# Patient Record
Sex: Male | Born: 1955 | Race: White | Hispanic: No | Smoking: Never smoker
Health system: Southern US, Community
[De-identification: ages and names within clinical notes are randomized; demographics above are authoritative.]

## PROBLEM LIST (undated history)

## (undated) DIAGNOSIS — G459 Transient cerebral ischemic attack, unspecified: Secondary | ICD-10-CM

## (undated) DIAGNOSIS — I639 Cerebral infarction, unspecified: Secondary | ICD-10-CM

## (undated) DIAGNOSIS — D6861 Antiphospholipid syndrome: Secondary | ICD-10-CM

## (undated) DIAGNOSIS — F32A Depression, unspecified: Secondary | ICD-10-CM

## (undated) DIAGNOSIS — I10 Essential (primary) hypertension: Secondary | ICD-10-CM

## (undated) DIAGNOSIS — E785 Hyperlipidemia, unspecified: Secondary | ICD-10-CM

## (undated) HISTORY — DX: Depression, unspecified: F32.A

## (undated) HISTORY — DX: Essential (primary) hypertension: I10

## (undated) HISTORY — DX: Hyperlipidemia, unspecified: E78.5

## (undated) HISTORY — DX: Antiphospholipid syndrome: D68.61

---

## 2009-11-29 ENCOUNTER — Emergency Department (HOSPITAL_COMMUNITY): Admission: EM | Admit: 2009-11-29 | Discharge: 2009-11-29 | Payer: Self-pay | Admitting: Emergency Medicine

## 2011-11-18 IMAGING — CT CT HEAD W/O CM
1 series · 15 of 30 positions shown, 19 images · non-contrast
Comparison: None.

CLINICAL DATA: Headache, dizziness and vomiting.

CT HEAD WITHOUT CONTRAST
TECHNIQUE: Contiguous axial images were obtained from the base of
the skull through the vertex without contrast.

[Series 2: headseq 4.8 h37s · axial · 0.51mm/px · z∈[+150,+318]mm · 15 of 36 slices shown, 19 images]
[im 2/36  brain]
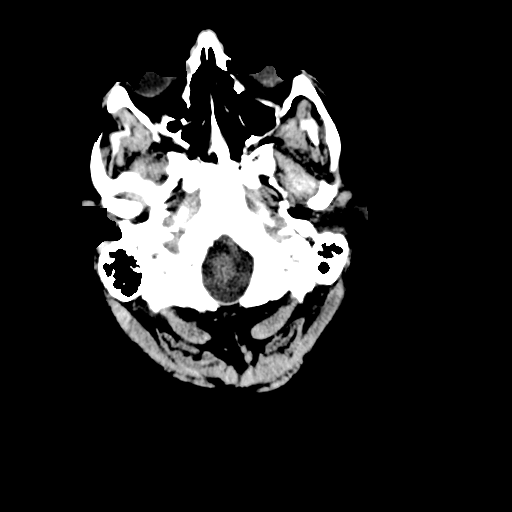
[im 2/36  bone]
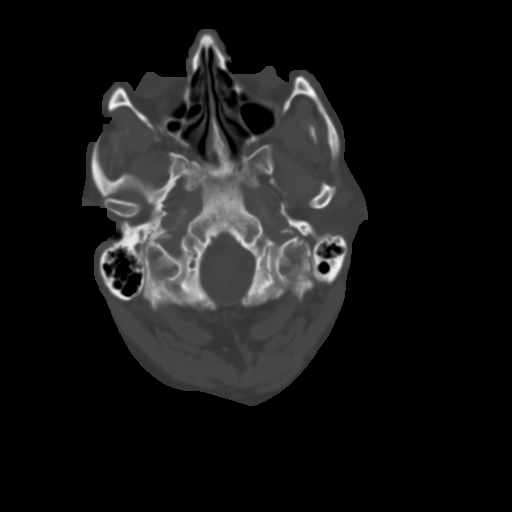
[im 4/36  brain]
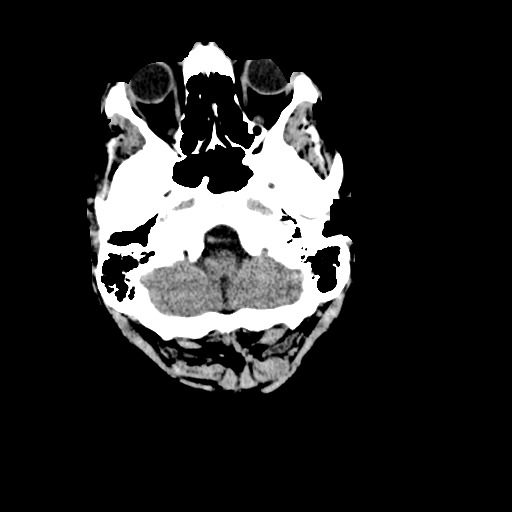
[im 7/36  brain]
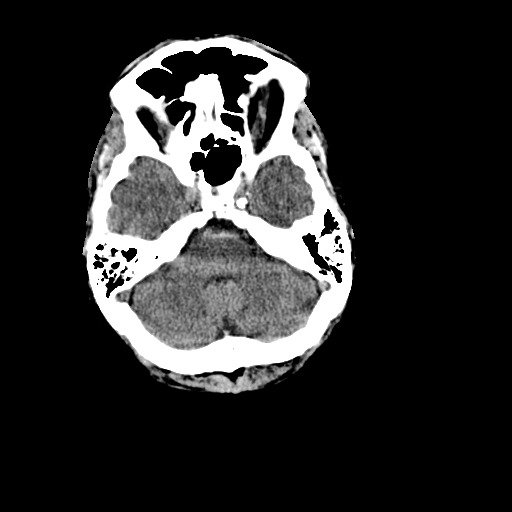
[im 9/36  brain]
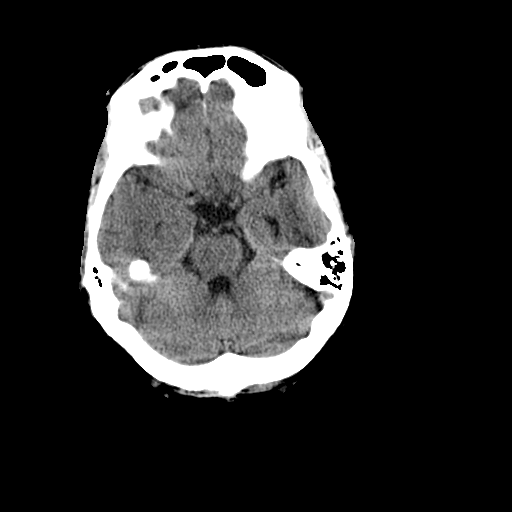
[im 11/36  brain]
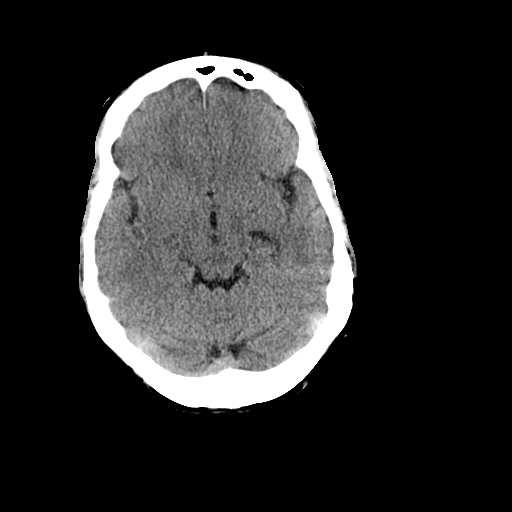
[im 11/36  bone]
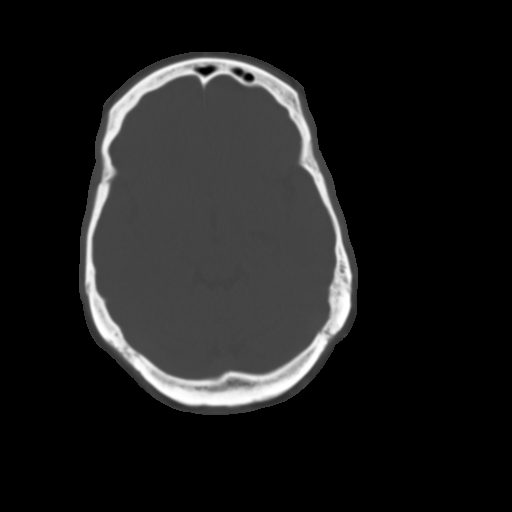
[im 14/36  brain]
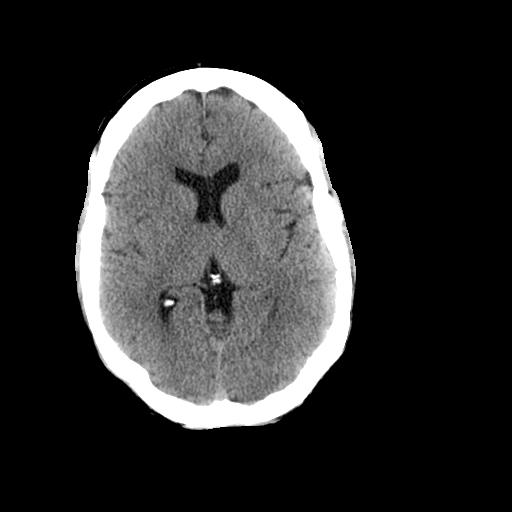
[im 16/36  brain]
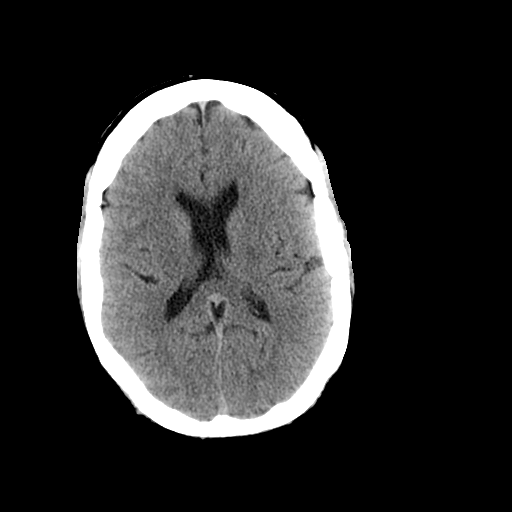
[im 19/36  brain]
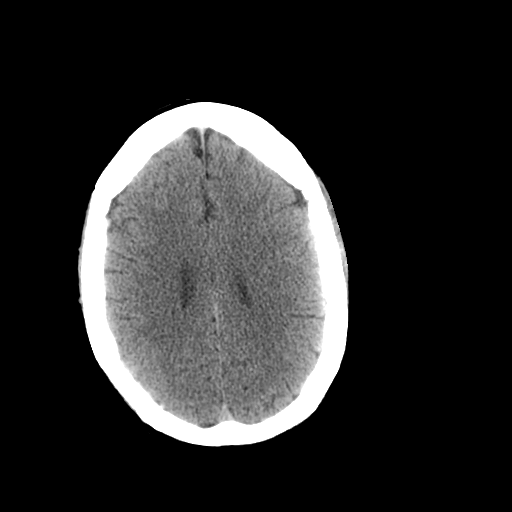
[im 20/36  brain]
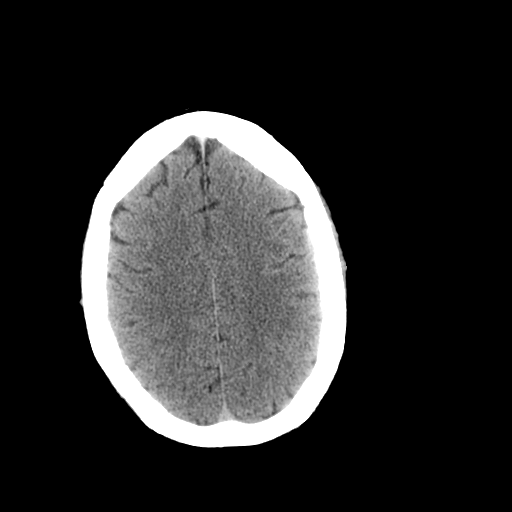
[im 20/36  bone]
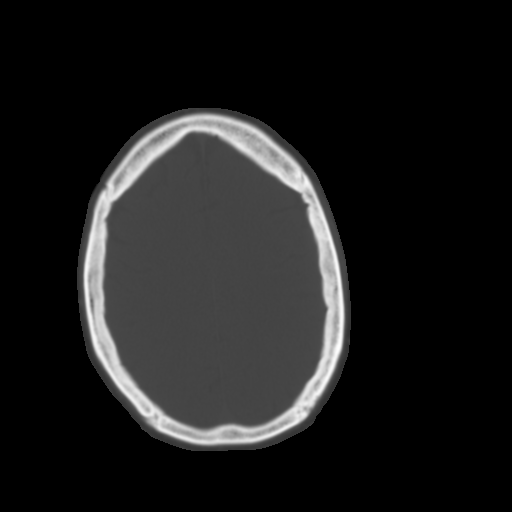
[im 22/36  brain]
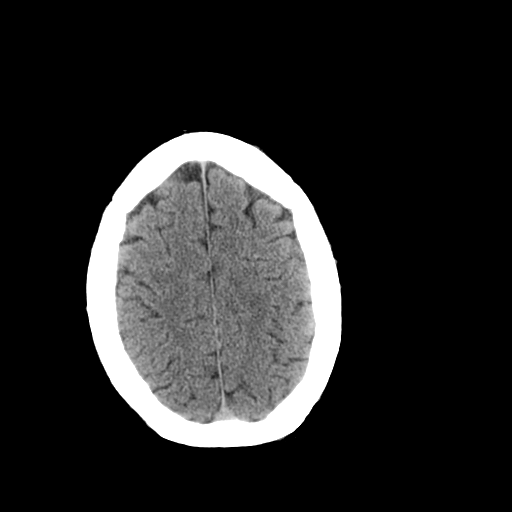
[im 25/36  brain]
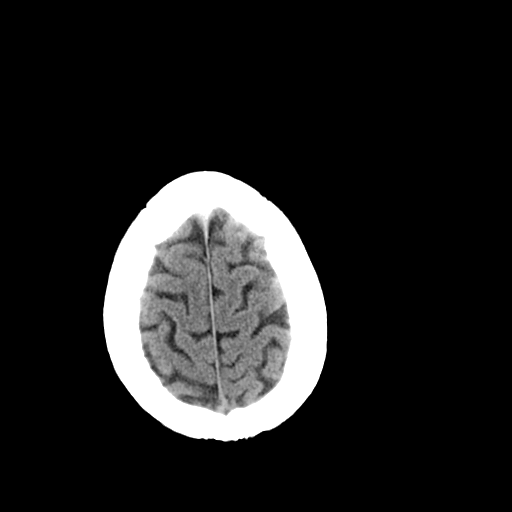
[im 27/36  brain]
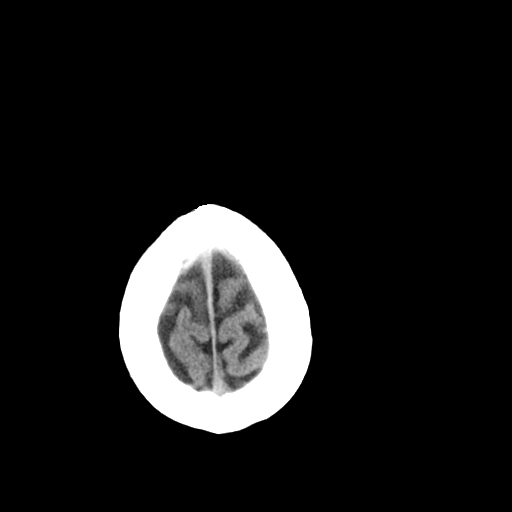
[im 29/36  brain]
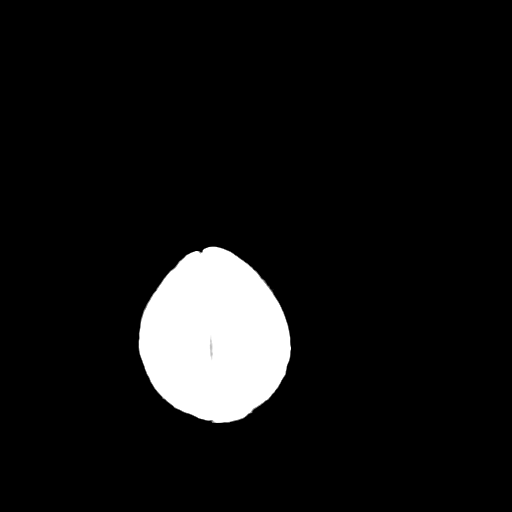
[im 29/36  bone]
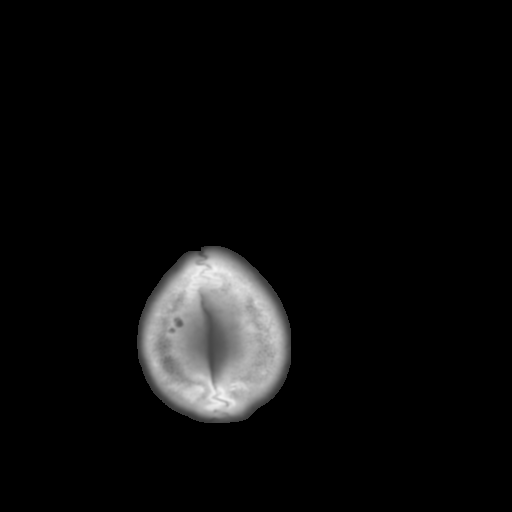
[im 32/36  brain]
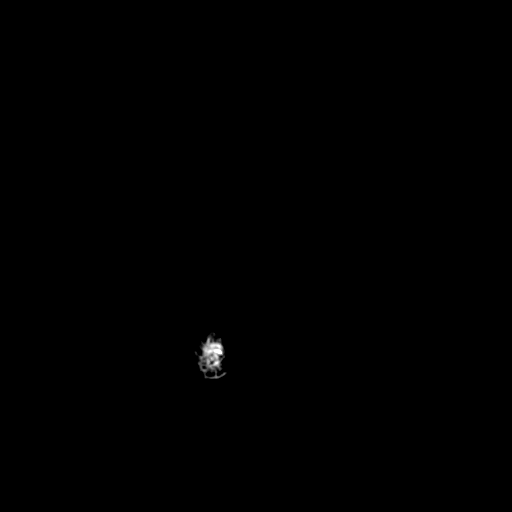
[im 34/36  brain]
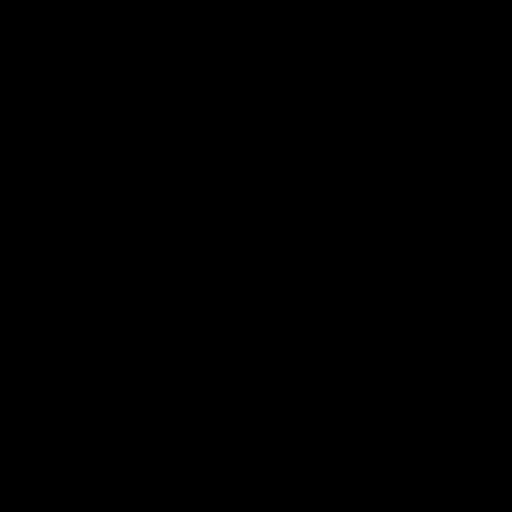

[15 of 30 positions shown; findings below may reference images not displayed]

FINDINGS: There is no evidence of acute infarction, mass lesion, or
intra- or extra-axial hemorrhage on CT.

Mild periventricular white matter change likely reflects small
vessel ischemic microangiopathy.  Minimal hypodensity within the
left basal ganglia may reflect chronic ischemic change.

The posterior fossa, including the cerebellum, brainstem and fourth
ventricle, is within normal limits.  The third and lateral
ventricles are unremarkable in appearance.  The cerebral
hemispheres are symmetric in appearance, with normal gray-white
differentiation.  No mass effect or midline shift is seen.

There is no evidence of fracture; visualized osseous structures are
unremarkable in appearance.  The visualized portions of the orbits
are within normal limits.  The paranasal sinuses and mastoid air
cells are well-aerated.  No significant soft tissue abnormalities
are seen.
IMPRESSION: 1.  No acute intracranial pathology seen.
2.  Mild small vessel ischemic microangiopathy; minimal hypodensity
within the left basal ganglia may reflect chronic ischemic change.

## 2019-10-15 ENCOUNTER — Other Ambulatory Visit: Payer: Self-pay

## 2019-10-15 ENCOUNTER — Inpatient Hospital Stay (HOSPITAL_COMMUNITY)
Admission: EM | Admit: 2019-10-15 | Discharge: 2019-10-18 | DRG: 071 | Disposition: A | Discharge status: Home / Self Care | Payer: Medicare Other | Attending: Family Medicine | Admitting: Family Medicine

## 2019-10-15 DIAGNOSIS — Z20822 Contact with and (suspected) exposure to covid-19: Secondary | ICD-10-CM | POA: Diagnosis present

## 2019-10-15 DIAGNOSIS — I119 Hypertensive heart disease without heart failure: Secondary | ICD-10-CM | POA: Diagnosis present

## 2019-10-15 DIAGNOSIS — R6510 Systemic inflammatory response syndrome (SIRS) of non-infectious origin without acute organ dysfunction: Secondary | ICD-10-CM | POA: Diagnosis present

## 2019-10-15 DIAGNOSIS — G9340 Encephalopathy, unspecified: Secondary | ICD-10-CM | POA: Diagnosis present

## 2019-10-15 DIAGNOSIS — D696 Thrombocytopenia, unspecified: Secondary | ICD-10-CM | POA: Diagnosis present

## 2019-10-15 DIAGNOSIS — R9431 Abnormal electrocardiogram [ECG] [EKG]: Secondary | ICD-10-CM | POA: Diagnosis present

## 2019-10-15 DIAGNOSIS — G9341 Metabolic encephalopathy: Principal | ICD-10-CM | POA: Diagnosis present

## 2019-10-15 DIAGNOSIS — D72829 Elevated white blood cell count, unspecified: Secondary | ICD-10-CM | POA: Diagnosis present

## 2019-10-15 DIAGNOSIS — R17 Unspecified jaundice: Secondary | ICD-10-CM | POA: Diagnosis present

## 2019-10-15 DIAGNOSIS — D649 Anemia, unspecified: Secondary | ICD-10-CM | POA: Diagnosis present

## 2019-10-15 DIAGNOSIS — R531 Weakness: Secondary | ICD-10-CM | POA: Diagnosis present

## 2019-10-15 DIAGNOSIS — Z8673 Personal history of transient ischemic attack (TIA), and cerebral infarction without residual deficits: Secondary | ICD-10-CM

## 2019-10-15 DIAGNOSIS — E876 Hypokalemia: Secondary | ICD-10-CM | POA: Diagnosis not present

## 2019-10-15 DIAGNOSIS — R509 Fever, unspecified: Secondary | ICD-10-CM | POA: Diagnosis present

## 2019-10-15 HISTORY — DX: Transient cerebral ischemic attack, unspecified: G45.9

## 2019-10-15 HISTORY — DX: Cerebral infarction, unspecified: I63.9

## 2019-10-15 NOTE — ED Triage Notes (Signed)
Pt arrives via EMS for altered mental status, fever, hypertension, and cough.

## 2019-10-16 ENCOUNTER — Emergency Department (HOSPITAL_COMMUNITY): Payer: Medicare Other

## 2019-10-16 ENCOUNTER — Encounter (HOSPITAL_COMMUNITY): Payer: Self-pay | Admitting: Emergency Medicine

## 2019-10-16 ENCOUNTER — Other Ambulatory Visit: Payer: Self-pay

## 2019-10-16 DIAGNOSIS — R9431 Abnormal electrocardiogram [ECG] [EKG]: Secondary | ICD-10-CM

## 2019-10-16 DIAGNOSIS — Z8673 Personal history of transient ischemic attack (TIA), and cerebral infarction without residual deficits: Secondary | ICD-10-CM | POA: Diagnosis not present

## 2019-10-16 DIAGNOSIS — R6510 Systemic inflammatory response syndrome (SIRS) of non-infectious origin without acute organ dysfunction: Secondary | ICD-10-CM | POA: Diagnosis present

## 2019-10-16 DIAGNOSIS — G9340 Encephalopathy, unspecified: Secondary | ICD-10-CM | POA: Diagnosis not present

## 2019-10-16 DIAGNOSIS — E876 Hypokalemia: Secondary | ICD-10-CM | POA: Diagnosis present

## 2019-10-16 LAB — CBC WITH DIFFERENTIAL/PLATELET
Abs Immature Granulocytes: 0.15 10*3/uL — ABNORMAL HIGH (ref 0.00–0.07)
Basophils Absolute: 0 10*3/uL (ref 0.0–0.1)
Basophils Relative: 0 %
Eosinophils Absolute: 0 10*3/uL (ref 0.0–0.5)
Eosinophils Relative: 0 %
HCT: 40.6 % (ref 39.0–52.0)
Hemoglobin: 13.7 g/dL (ref 13.0–17.0)
Immature Granulocytes: 1 %
Lymphocytes Relative: 10 %
Lymphs Abs: 1.9 10*3/uL (ref 0.7–4.0)
MCH: 29.7 pg (ref 26.0–34.0)
MCHC: 33.7 g/dL (ref 30.0–36.0)
MCV: 88.1 fL (ref 80.0–100.0)
Monocytes Absolute: 1.5 10*3/uL — ABNORMAL HIGH (ref 0.1–1.0)
Monocytes Relative: 8 %
Neutro Abs: 15.7 10*3/uL — ABNORMAL HIGH (ref 1.7–7.7)
Neutrophils Relative %: 81 %
Platelets: 177 10*3/uL (ref 150–400)
RBC: 4.61 MIL/uL (ref 4.22–5.81)
RDW: 13.7 % (ref 11.5–15.5)
WBC: 19.3 10*3/uL — ABNORMAL HIGH (ref 4.0–10.5)
nRBC: 0 % (ref 0.0–0.2)

## 2019-10-16 LAB — COMPREHENSIVE METABOLIC PANEL
ALT: 15 U/L (ref 0–44)
AST: 15 U/L (ref 15–41)
Albumin: 3.7 g/dL (ref 3.5–5.0)
Alkaline Phosphatase: 99 U/L (ref 38–126)
Anion gap: 11 (ref 5–15)
BUN: 13 mg/dL (ref 8–23)
CO2: 26 mmol/L (ref 22–32)
Calcium: 8 mg/dL — ABNORMAL LOW (ref 8.9–10.3)
Chloride: 95 mmol/L — ABNORMAL LOW (ref 98–111)
Creatinine, Ser: 0.96 mg/dL (ref 0.61–1.24)
GFR calc Af Amer: >60 mL/min (ref >60)
GFR calc non Af Amer: >60 mL/min (ref >60)
Glucose, Bld: 142 mg/dL — ABNORMAL HIGH (ref 70–99)
Potassium: 2.8 mmol/L — ABNORMAL LOW (ref 3.5–5.1)
Sodium: 132 mmol/L — ABNORMAL LOW (ref 135–145)
Total Bilirubin: 2.6 mg/dL — ABNORMAL HIGH (ref 0.3–1.2)
Total Protein: 6.8 g/dL (ref 6.5–8.1)

## 2019-10-16 LAB — BILIRUBIN, FRACTIONATED(TOT/DIR/INDIR)
Bilirubin, Direct: 0.3 mg/dL — ABNORMAL HIGH (ref 0.0–0.2)
Indirect Bilirubin: 2.5 mg/dL — ABNORMAL HIGH (ref 0.3–0.9)
Total Bilirubin: 2.8 mg/dL — ABNORMAL HIGH (ref 0.3–1.2)

## 2019-10-16 LAB — URINALYSIS, ROUTINE W REFLEX MICROSCOPIC
Bacteria, UA: NONE SEEN
Bilirubin Urine: NEGATIVE
Glucose, UA: NEGATIVE mg/dL
Ketones, ur: NEGATIVE mg/dL
Leukocytes,Ua: NEGATIVE
Nitrite: NEGATIVE
Protein, ur: NEGATIVE mg/dL
Specific Gravity, Urine: 1.01 (ref 1.005–1.030)
pH: 6 (ref 5.0–8.0)

## 2019-10-16 LAB — D-DIMER, QUANTITATIVE: D-Dimer, Quant: 0.51 ug/mL-FEU — ABNORMAL HIGH (ref 0.00–0.50)

## 2019-10-16 LAB — FERRITIN: Ferritin: 283 ng/mL (ref 24–336)

## 2019-10-16 LAB — LACTIC ACID, PLASMA
Lactic Acid, Venous: 0.9 mmol/L (ref 0.5–1.9)
Lactic Acid, Venous: 0.8 mmol/L (ref 0.5–1.9)

## 2019-10-16 LAB — PROCALCITONIN: Procalcitonin: <0.1 ng/mL

## 2019-10-16 LAB — C-REACTIVE PROTEIN: CRP: 18.6 mg/dL — ABNORMAL HIGH (ref <1.0)

## 2019-10-16 LAB — SARS CORONAVIRUS 2 BY RT PCR (HOSPITAL ORDER, PERFORMED IN ~~LOC~~ HOSPITAL LAB): SARS Coronavirus 2: NEGATIVE

## 2019-10-16 LAB — LACTATE DEHYDROGENASE: LDH: 128 U/L (ref 98–192)

## 2019-10-16 LAB — MAGNESIUM: Magnesium: 1.8 mg/dL (ref 1.7–2.4)

## 2019-10-16 LAB — TRIGLYCERIDES: Triglycerides: 75 mg/dL (ref <150)

## 2019-10-16 LAB — FIBRINOGEN: Fibrinogen: 664 mg/dL — ABNORMAL HIGH (ref 210–475)

## 2019-10-16 MED ORDER — ACETAMINOPHEN 650 MG RE SUPP: 650.0000 mg | Freq: Four times a day (QID) | RECTAL | Status: DC | PRN

## 2019-10-16 MED ORDER — METRONIDAZOLE IN NACL 5-0.79 MG/ML-% IV SOLN
500.0000 mg | Freq: Three times a day (TID) | INTRAVENOUS | Status: DC
  Administered 2019-10-16 – 2019-10-17 (×4): 500 mg via INTRAVENOUS
  Filled 2019-10-16 (×4): qty 100

## 2019-10-16 MED ORDER — ACETAMINOPHEN 325 MG PO TABS
650.0000 mg | ORAL_TABLET | Freq: Once | ORAL | Status: AC
  Administered 2019-10-16: 650 mg via ORAL
  Filled 2019-10-16: qty 2

## 2019-10-16 MED ORDER — SODIUM CHLORIDE 0.9% FLUSH
3.0000 mL | Freq: Two times a day (BID) | INTRAVENOUS | Status: DC
  Administered 2019-10-16 – 2019-10-17 (×3): 3 mL via INTRAVENOUS

## 2019-10-16 MED ORDER — SODIUM CHLORIDE 0.9 % IV SOLN
2.0000 g | Freq: Three times a day (TID) | INTRAVENOUS | Status: DC
  Administered 2019-10-16 – 2019-10-18 (×8): 2 g via INTRAVENOUS
  Filled 2019-10-16 (×8): qty 2

## 2019-10-16 MED ORDER — SODIUM CHLORIDE 0.9 % IV BOLUS
1000.0000 mL | Freq: Once | INTRAVENOUS | Status: AC
  Administered 2019-10-16: 1000 mL via INTRAVENOUS

## 2019-10-16 MED ORDER — POTASSIUM CHLORIDE IN NACL 40-0.9 MEQ/L-% IV SOLN
INTRAVENOUS | Status: AC
  Filled 2019-10-16: qty 1000

## 2019-10-16 MED ORDER — VANCOMYCIN HCL IN DEXTROSE 1-5 GM/200ML-% IV SOLN
1000.0000 mg | Freq: Once | INTRAVENOUS | Status: AC
  Administered 2019-10-16: 1000 mg via INTRAVENOUS
  Filled 2019-10-16: qty 200

## 2019-10-16 MED ORDER — ATORVASTATIN CALCIUM 40 MG PO TABS
40.0000 mg | ORAL_TABLET | Freq: Every day | ORAL | Status: DC
  Administered 2019-10-16 – 2019-10-17 (×2): 40 mg via ORAL
  Filled 2019-10-16 (×2): qty 1

## 2019-10-16 MED ORDER — POTASSIUM CHLORIDE CRYS ER 20 MEQ PO TBCR
40.0000 meq | EXTENDED_RELEASE_TABLET | Freq: Once | ORAL | Status: AC
  Administered 2019-10-16: 40 meq via ORAL
  Filled 2019-10-16: qty 2

## 2019-10-16 MED ORDER — ASPIRIN EC 81 MG PO TBEC
81.0000 mg | DELAYED_RELEASE_TABLET | Freq: Every day | ORAL | Status: DC
  Administered 2019-10-16 – 2019-10-17 (×2): 81 mg via ORAL
  Filled 2019-10-16 (×2): qty 1

## 2019-10-16 MED ORDER — SENNOSIDES-DOCUSATE SODIUM 8.6-50 MG PO TABS
1.0000 | ORAL_TABLET | Freq: Every evening | ORAL | Status: DC | PRN
  Filled 2019-10-16: qty 1

## 2019-10-16 MED ORDER — PIPERACILLIN-TAZOBACTAM 3.375 G IVPB 30 MIN
3.3750 g | Freq: Once | INTRAVENOUS | Status: AC
  Administered 2019-10-16: 3.375 g via INTRAVENOUS
  Filled 2019-10-16: qty 50

## 2019-10-16 MED ORDER — ENOXAPARIN SODIUM 60 MG/0.6ML ~~LOC~~ SOLN
50.0000 mg | SUBCUTANEOUS | Status: DC
  Administered 2019-10-17 – 2019-10-18 (×2): 50 mg via SUBCUTANEOUS
  Filled 2019-10-16 (×2): qty 0.6

## 2019-10-16 MED ORDER — ACETAMINOPHEN 325 MG PO TABS: 650.0000 mg | ORAL_TABLET | Freq: Four times a day (QID) | ORAL | Status: DC | PRN

## 2019-10-16 MED ORDER — MAGNESIUM SULFATE IN D5W 1-5 GM/100ML-% IV SOLN
1.0000 g | Freq: Once | INTRAVENOUS | Status: AC
  Administered 2019-10-16: 1 g via INTRAVENOUS
  Filled 2019-10-16: qty 100

## 2019-10-16 MED ORDER — VANCOMYCIN HCL IN DEXTROSE 1-5 GM/200ML-% IV SOLN
1000.0000 mg | Freq: Two times a day (BID) | INTRAVENOUS | Status: DC
  Administered 2019-10-16 – 2019-10-17 (×2): 1000 mg via INTRAVENOUS
  Filled 2019-10-16 (×2): qty 200

## 2019-10-16 NOTE — Progress Notes (Addendum)
Patient seen and evaluated, chart reviewed, please see EMR for updated orders. Please see full H&P dictated by admitting physician for same date of service.    Brief Summary:- 64 y.o. male with medical history significant for hypertension and history of CVA admitted on 10/16/19 with SIRS--unknown source of infection  A/p 1)SIRS--patient with fevers, leukocytosis and confusion -UA and chest imaging studies without evidence of infection -COVID-19 negative PCT < 0.10 CRP 18.6 Fibrinogen 664 WBC 19.3 -Continue cefepime and vancomycin pending cultures   2) acute metabolic encephalopathy----most likely secondary to #1 above --History of some intermittent confusion at baseline as well as labile emotions --Appears less confused, appears more coherent  3) prolonged QT in the setting of hypokalemia--- replace, and recheck, mag WNL  4) elevated bilirubin--repeat CMP in a.m.  5) prior history of CVA--aspirin and Lipitor as ordered  Family Communications:-Voicemail for granddaughter , Ms [place]---[phone]  --Total care time over 39 minutes  Patient seen and evaluated, chart reviewed, please see EMR for updated orders. Please see full H&P dictated by admitting physician for same date of service.

## 2019-10-16 NOTE — ED Provider Notes (Signed)
[facility] EMERGENCY DEPARTMENT Provider Note   CSN: [id number] Arrival date & time: 10/15/19  2345   Time seen 12:05 AM  History Chief Complaint  Patient presents with  . Altered Mental Status   Level 5 caveat for altered mental status  Daniel Wolf is a 64 y.o. male.  HPI Patient was brought in by EMS for complaints of altered mental status, fever, hypertension and cough.  When I asked the patient what is going on he does not know why he is here.  He states he is here because his blood pressure was high.  I asked him why he they checked his blood pressure he does not know.  He denies headache, chest pain, shortness of breath, cough, nausea, vomiting, or diarrhea.  He thinks he may have had a fever but does not know when it started.  He denies being around anybody who is sick.  Nurse reports to me he told her he had a fake eye, but he does not  Patient states he has not had the Covid vaccine.    Past Medical History:  Diagnosis Date  . CVA (cerebral vascular accident) (HCC)   . Mini stroke [facility])     Patient Active Problem List   Diagnosis Date Noted  . SIRS (systemic inflammatory response syndrome) (HCC) 10/16/2019  . History of CVA (cerebrovascular accident) 10/16/2019  . Hypokalemia 10/16/2019  . Prolonged QT interval 10/16/2019  . Hyperbilirubinemia 10/16/2019    History reviewed. No pertinent surgical history.     History reviewed. No pertinent family history.  Social History   Tobacco Use  . Smoking status: Not on file  Substance Use Topics  . Alcohol use: Not on file  . Drug use: Not on file  Patient states he lives with his granddaughter, however EMS states they picked him up in a boarding home  Home Medications Prior to Admission medications   Not on File  He states one of his pills is a cholesterol pill  Allergies    Patient has no known allergies.  Review of Systems   Review of Systems  Unable to perform ROS: Mental status change     Physical Exam Updated Vital Signs BP (!) 144/82   Pulse 70   Temp 98.3 F (36.8 C) (Oral)   Resp (!) 22   Ht 5\' 10"  (1.778 m)   Wt 106.6 kg   SpO2 97%   BMI 33.72 kg/m   Physical Exam Vitals and nursing note reviewed.  Constitutional:      General: He is not in acute distress.    Appearance: Normal appearance. He is obese.  HENT:     Head: Normocephalic and atraumatic.  Eyes:     Extraocular Movements: Extraocular movements intact.     Conjunctiva/sclera: Conjunctivae normal.     Pupils: Pupils are equal, round, and reactive to light.  Cardiovascular:     Rate and Rhythm: Normal rate and regular rhythm.     Pulses: Normal pulses.     Heart sounds: Normal heart sounds.  Pulmonary:     Effort: Pulmonary effort is normal. No respiratory distress.     Breath sounds: Normal breath sounds.  Abdominal:     General: Abdomen is flat. Bowel sounds are normal.     Palpations: Abdomen is soft.     Tenderness: There is no abdominal tenderness.  Musculoskeletal:        General: Normal range of motion.     Cervical back: Normal range of  motion.  Skin:    General: Skin is warm and dry.  Neurological:     General: No focal deficit present.     Mental Status: He is alert.     Cranial Nerves: No cranial nerve deficit.     Comments: Patient is confused.  Psychiatric:        Mood and Affect: Affect is flat.        Speech: Speech is delayed.        Behavior: Behavior is slowed.     ED Results / Procedures / Treatments   Labs (all labs ordered are listed, but only abnormal results are displayed) Results for orders placed or performed during the hospital encounter of 10/15/19  SARS Coronavirus 2 by RT PCR (hospital order, performed in [facility] hospital lab) Nasopharyngeal Nasopharyngeal Swab   Specimen: Nasopharyngeal Swab  Result Value Ref Range   SARS Coronavirus 2 NEGATIVE NEGATIVE  Lactic acid, plasma  Result Value Ref Range   Lactic Acid, Venous 0.9 0.5 - 1.9  mmol/L  Lactic acid, plasma  Result Value Ref Range   Lactic Acid, Venous 0.8 0.5 - 1.9 mmol/L  CBC WITH DIFFERENTIAL  Result Value Ref Range   WBC 19.3 (H) 4.0 - 10.5 K/uL   RBC 4.61 4.22 - 5.81 MIL/uL   Hemoglobin 13.7 13.0 - 17.0 g/dL   HCT 62.3 39 - 52 %   MCV 88.1 80.0 - 100.0 fL   MCH 29.7 26.0 - 34.0 pg   MCHC 33.7 30.0 - 36.0 g/dL   RDW 76.2 83.1 - 51.7 %   Platelets 177 150 - 400 K/uL   nRBC 0.0 0.0 - 0.2 %   Neutrophils Relative % 81 %   Neutro Abs 15.7 (H) 1.7 - 7.7 K/uL   Lymphocytes Relative 10 %   Lymphs Abs 1.9 0.7 - 4.0 K/uL   Monocytes Relative 8 %   Monocytes Absolute 1.5 (H) 0 - 1 K/uL   Eosinophils Relative 0 %   Eosinophils Absolute 0.0 0 - 0 K/uL   Basophils Relative 0 %   Basophils Absolute 0.0 0 - 0 K/uL   Immature Granulocytes 1 %   Abs Immature Granulocytes 0.15 (H) 0.00 - 0.07 K/uL  Comprehensive metabolic panel  Result Value Ref Range   Sodium 132 (L) 135 - 145 mmol/L   Potassium 2.8 (L) 3.5 - 5.1 mmol/L   Chloride 95 (L) 98 - 111 mmol/L   CO2 26 22 - 32 mmol/L   Glucose, Bld 142 (H) 70 - 99 mg/dL   BUN 13 8 - 23 mg/dL   Creatinine, Ser 6.16 0.61 - 1.24 mg/dL   Calcium 8.0 (L) 8.9 - 10.3 mg/dL   Total Protein 6.8 6.5 - 8.1 g/dL   Albumin 3.7 3.5 - 5.0 g/dL   AST 15 15 - 41 U/L   ALT 15 0 - 44 U/L   Alkaline Phosphatase 99 38 - 126 U/L   Total Bilirubin 2.6 (H) 0.3 - 1.2 mg/dL   GFR calc non Af Amer >60 >60 mL/min   GFR calc Af Amer >60 >60 mL/min   Anion gap 11 5 - 15  D-dimer, quantitative  Result Value Ref Range   D-Dimer, Quant 0.51 (H) 0.00 - 0.50 ug/mL-FEU  Procalcitonin  Result Value Ref Range   Procalcitonin <0.10 ng/mL  Lactate dehydrogenase  Result Value Ref Range   LDH 128 98 - 192 U/L  Ferritin  Result Value Ref Range   Ferritin 283 24 -  336 ng/mL  Triglycerides  Result Value Ref Range   Triglycerides 75 <150 mg/dL  Fibrinogen  Result Value Ref Range   Fibrinogen 664 (H) 210 - 475 mg/dL  C-reactive protein   Result Value Ref Range   CRP 18.6 (H) <1.0 mg/dL  Urinalysis, Routine w reflex microscopic Urine, Clean Catch  Result Value Ref Range   Color, Urine YELLOW YELLOW   APPearance CLEAR CLEAR   Specific Gravity, Urine 1.010 1.005 - 1.030   pH 6.0 5.0 - 8.0   Glucose, UA NEGATIVE NEGATIVE mg/dL   Hgb urine dipstick SMALL (A) NEGATIVE   Bilirubin Urine NEGATIVE NEGATIVE   Ketones, ur NEGATIVE NEGATIVE mg/dL   Protein, ur NEGATIVE NEGATIVE mg/dL   Nitrite NEGATIVE NEGATIVE   Leukocytes,Ua NEGATIVE NEGATIVE   RBC / HPF 0-5 0 - 5 RBC/hpf   WBC, UA 0-5 0 - 5 WBC/hpf   Bacteria, UA NONE SEEN NONE SEEN  Magnesium  Result Value Ref Range   Magnesium 1.8 1.7 - 2.4 mg/dL     Laboratory interpretation all normal except hypokalemia, leukocytosis, nonfasting hyperglycemia, normal D-dimer when corrected for age   EKG EKG Interpretation  Date/Time:  Monday October 16 2019 00:40:55 EDT Ventricular Rate:  85 PR Interval:    QRS Duration: 104 QT Interval:  426 QTC Calculation: 507 R Axis:   84 Text Interpretation: Sinus rhythm atrial bigeminy Atrial premature complex Consider right ventricular hypertrophy Borderline repol abnrm, anterolateral leads Prolonged QT interval No old tracing to compare Confirmed by Devoria Albe ([id number]) on 10/16/2019 12:49:29 AM   Radiology DG Chest Port 1 View  Result Date: 10/16/2019 CLINICAL DATA:  Fever, cough, and confusion. EXAM: PORTABLE CHEST 1 VIEW COMPARISON:  None. FINDINGS: Mild cardiac enlargement. No edema, consolidation, or effusion. Mediastinal contours appear intact. IMPRESSION: Mild cardiac enlargement. No evidence of active pulmonary disease. Electronically Signed   By: Burman Nieves M.D.   On: 10/16/2019 01:32    Procedures .Critical Care Performed by: Devoria Albe, MD Authorized by: Devoria Albe, MD   Critical care provider statement:    Critical care time (minutes):  39   Critical care was necessary to treat or prevent imminent or  life-threatening deterioration of the following conditions:  Circulatory failure   Critical care was time spent personally by me on the following activities:  Discussions with consultants, examination of patient, obtaining history from patient or surrogate, ordering and review of laboratory studies, ordering and review of radiographic studies, pulse oximetry, re-evaluation of patient's condition and review of old charts   (including critical care time)  Medications Ordered in ED Medications  acetaminophen (TYLENOL) tablet 650 mg (650 mg Oral Given 10/16/19 0333)  potassium chloride SA (KLOR-CON) CR tablet 40 mEq (40 mEq Oral Given 10/16/19 0333)  sodium chloride 0.9 % bolus 1,000 mL (0 mLs Intravenous Stopped 10/16/19 0452)  sodium chloride 0.9 % bolus 1,000 mL (0 mLs Intravenous Stopped 10/16/19 0452)  piperacillin-tazobactam (ZOSYN) IVPB 3.375 g (0 g Intravenous Stopped 10/16/19 0452)  vancomycin (VANCOCIN) IVPB 1000 mg/200 mL premix (0 mg Intravenous Stopped 10/16/19 0533)    ED Course  I have reviewed the triage vital signs and the nursing notes.  Pertinent labs & imaging results that were available during my care of the patient were reviewed by me and considered in my medical decision making (see chart for details).    MDM Rules/Calculators/A&P  Patient presents via EMS with fever and cough, concern is for Covid.  Testing was started to evaluate him for Covid but also look for other source of fever such as urinary tract infection.  Patient's fever was treated with acetaminophen.  After his Covid test came back negative he was given IV fluids.  Basically there has been no source of his fever found but he has not provided a urine sample yet.  He was given Zosyn and vancomycin since he has fever of unknown source.   4:00 AM I rechecked patient.  He knows the day of the week, he knows it is Labor Day, he knows the year.  He states that he had no symptoms including headache,  neck pain, or abdominal pain.  He moves his head freely during the course of conversation.  His heart rate is 79 and blood pressure is 144/82.  We are still waiting on a urine sample.  Patient was given antibiotics for fever with unknown source, Zosyn and vancomycin.  His urine has come back and without evidence of infection.  Patient has pending blood cultures and urine culture.  His Covid test is also negative.  Patient does not seem confused now that his fever is controlled.  When I review his chart he has a history of prior stroke, I wonder if the fever just affected him cognitively because of his prior stroke.  He certainly seems alert and oriented now.  5:53 AM Dr. Antionette Charpyd, hospitalist will admit  Daniel Wolf was evaluated in Emergency Department on 10/16/2019 for the symptoms described in the history of present illness. He was evaluated in the context of the global COVID-19 pandemic, which necessitated consideration that the patient might be at risk for infection with the SARS-CoV-2 virus that causes COVID-19. Institutional protocols and algorithms that pertain to the evaluation of patients at risk for COVID-19 are in a state of rapid change based on information released by regulatory bodies including the CDC and federal and state organizations. These policies and algorithms were followed during the patient's care in the ED.   Final Clinical Impression(s) / ED Diagnoses Final diagnoses:  Fever, unspecified fever cause  Hypokalemia    Rx / DC Orders  Plan admission  Devoria AlbeIva Avrey Hyser, MD, [facility] PyoFACEP    Silas Muff, MD 10/16/19 [phone]

## 2019-10-16 NOTE — H&P (Signed)
History and Physical    [name] QIW:[id number] DOB: [birth year] DOA: 10/15/2019  PCP: Patient, No Pcp Per   Patient coming from: Home   Chief Complaint: Fever, confusion   HPI: Daniel Wolf is a 64 y.o. male with medical history significant for hypertension and history of CVA, now presenting to the emergency department for evaluation of fever and altered mental status.  EMS was called due to confusion, patient was found to be febrile, and brought into the ED for further evaluation.  He was reportedly confused initially on arrival to the ED, but this improved significantly with treatment of his fever and he was able to provide history, denying any recent cough, abdominal pain, dysuria, flank pain, neck stiffness, or headache.  Reports that he felt okay yesterday.  ED Course: Upon arrival to the ED, patient is found to be febrile to 38.4 C, saturating well on room air, and with normal heart rate, respiratory rate, and blood pressure.  EKG features sinus rhythm with PACs and QTc interval of 507 ms.  Chest x-ray features mild cardiomegaly but no acute cardiopulmonary disease.  Chemistry panel demonstrates hypokalemia with potassium 2.8 as well as elevation in total bilirubin to 2.6.  CBC notable for leukocytosis to 19,300.  Lactic acid reassuringly normal.  Covid PCR is negative.  D-dimer 0.51, procalcitonin undetectable, and CRP elevated to 18.6.  Blood and urine cultures were collected in the emergency department, 2 L of saline were administered, and the patient was treated with Tylenol, vancomycin, Zosyn, and potassium.  Review of Systems:  All other systems reviewed and apart from HPI, are negative.  Past Medical History:  Diagnosis Date  . CVA (cerebral vascular accident) (HCC)   . Mini stroke [facility])     History reviewed. No pertinent surgical history.  Social History:   has no history on file for tobacco use, alcohol use, and drug use.  No Known Allergies  History reviewed.  No pertinent family history.   Prior to Admission medications   Not on File    Physical Exam: Vitals:   10/16/19 0400 10/16/19 0422 10/16/19 0423 10/16/19 0423  BP: (!) 144/82     Pulse:  78 70   Resp:  19 (!) 22   Temp:    98.3 F (36.8 C)  TempSrc:    Oral  SpO2:  97% 97%   Weight:      Height:        Constitutional: NAD, calm  Eyes: PERTLA, lids and conjunctivae normal ENMT: Mucous membranes are moist. Posterior pharynx clear of any exudate or lesions.   Neck: normal, supple, no masses, no thyromegaly Respiratory: no wheezing, no crackles. No accessory muscle use.  Cardiovascular: S1 & S2 heard, regular rate and rhythm. No extremity edema.   Abdomen: No distension, no tenderness, soft. Bowel sounds active.  Musculoskeletal: no clubbing / cyanosis. No joint deformity upper and lower extremities.   Skin: no significant rashes, lesions, ulcers. Warm, dry, well-perfused. Neurologic: CN 2-12 grossly intact. Sensation intact. Moving all extremities.  Psychiatric: Sleeping, wakes to voice. Oriented to person, place, and situation.      Labs and Imaging on Admission: I have personally reviewed following labs and imaging studies  CBC: Recent Labs  Lab 10/16/19 0040  WBC 19.3*  NEUTROABS 15.7*  HGB 13.7  HCT 40.6  MCV 88.1  PLT 177   Basic Metabolic Panel: Recent Labs  Lab 10/16/19 0040  NA 132*  K 2.8*  CL 95*  CO2 26  GLUCOSE 142*  BUN 13  CREATININE 0.96  CALCIUM 8.0*  MG 1.8   GFR: Estimated Creatinine Clearance: 97.5 mL/min (by C-G formula based on SCr of 0.96 mg/dL). Liver Function Tests: Recent Labs  Lab 10/16/19 0040  AST 15  ALT 15  ALKPHOS 99  BILITOT 2.6*  PROT 6.8  ALBUMIN 3.7   No results for input(s): LIPASE, AMYLASE in the last 168 hours. No results for input(s): AMMONIA in the last 168 hours. Coagulation Profile: No results for input(s): INR, PROTIME in the last 168 hours. Cardiac Enzymes: No results for input(s): CKTOTAL, CKMB,  CKMBINDEX, TROPONINI in the last 168 hours. BNP (last 3 results) No results for input(s): PROBNP in the last 8760 hours. HbA1C: No results for input(s): HGBA1C in the last 72 hours. CBG: No results for input(s): GLUCAP in the last 168 hours. Lipid Profile: Recent Labs    10/16/19 0040  TRIG 75   Thyroid Function Tests: No results for input(s): TSH, T4TOTAL, FREET4, T3FREE, THYROIDAB in the last 72 hours. Anemia Panel: Recent Labs    10/16/19 0040  FERRITIN 283   Urine analysis:    Component Value Date/Time   COLORURINE YELLOW 10/16/2019 0420   APPEARANCEUR CLEAR 10/16/2019 0420   LABSPEC 1.010 10/16/2019 0420   PHURINE 6.0 10/16/2019 0420   GLUCOSEU NEGATIVE 10/16/2019 0420   HGBUR SMALL (A) 10/16/2019 0420   BILIRUBINUR NEGATIVE 10/16/2019 0420   KETONESUR NEGATIVE 10/16/2019 0420   PROTEINUR NEGATIVE 10/16/2019 0420   NITRITE NEGATIVE 10/16/2019 0420   LEUKOCYTESUR NEGATIVE 10/16/2019 0420   Sepsis Labs: @LABRCNTIP (procalcitonin:4,lacticidven:4) ) Recent Results (from the past 240 hour(s))  SARS Coronavirus 2 by RT PCR (hospital order, performed in [facility] hospital lab) Nasopharyngeal Nasopharyngeal Swab     Status: None   Collection Time: 10/16/19 12:40 AM   Specimen: Nasopharyngeal Swab  Result Value Ref Range Status   SARS Coronavirus 2 NEGATIVE NEGATIVE Final    Comment: (NOTE) SARS-CoV-2 target nucleic acids are NOT DETECTED.  The SARS-CoV-2 RNA is generally detectable in upper and lower respiratory specimens during the acute phase of infection. The lowest concentration of SARS-CoV-2 viral copies this assay can detect is 250 copies / mL. A negative result does not preclude SARS-CoV-2 infection and should not be used as the sole basis for treatment or other patient management decisions.  A negative result may occur with improper specimen collection / handling, submission of specimen other than nasopharyngeal swab, presence of viral mutation(s) within  the areas targeted by this assay, and inadequate number of viral copies (<250 copies / mL). A negative result must be combined with clinical observations, patient history, and epidemiological information.  Fact Sheet for Patients:   12/16/19  Fact Sheet for Healthcare Providers: BoilerBrush.com.cy  This test is not yet approved or  cleared by the https://pope.com/ FDA and has been authorized for detection and/or diagnosis of SARS-CoV-2 by FDA under an Emergency Use Authorization (EUA).  This EUA will remain in effect (meaning this test can be used) for the duration of the COVID-19 declaration under Section 564(b)(1) of the Act, 21 U.S.C. section 360bbb-3(b)(1), unless the authorization is terminated or revoked sooner.  Performed at [facility], [address] [place], [place]      Radiological Exams on Admission: DG Chest [facility] 1 View  Result Date: 10/16/2019 CLINICAL DATA:  Fever, cough, and confusion. EXAM: PORTABLE CHEST 1 VIEW COMPARISON:  None. FINDINGS: Mild cardiac enlargement. No edema, consolidation, or effusion. Mediastinal contours appear intact. IMPRESSION:  Mild cardiac enlargement. No evidence of active pulmonary disease. Electronically Signed   By: Burman Nieves M.D.   On: 10/16/2019 01:32    EKG: Independently reviewed. Sinus rhythm, PACs, QTc 507.   Assessment/Plan   1. SIRS  - Presents with fever and confusion and is found to be febrile with WBC 19,300, clear CXR, unremarkable UA, negative COVID pcr, benign abdominal exam, and no meningismus or any apparent cellulitis  - Blood and urine cultures were collected in ED and empiric antibiotics were started  - Continue broad-spectrum antibiotics for now, follow cultures and clinical course    2. Acute encephalopathy  - Per recent neurology notes, patient was having some confusion and labile emotions during recent office visit but baseline not clear  -  He was reportedly confused on arrival to ED but became oriented after treatment of his fever  - Likely secondary to fever/infection, but no meningismus present  - Continue supportive care, monitor    3. Hypokalemia  - Potassium is 2.8 on admission with prolonged QT interval  - He was given 40 mEq oral potassium in ED  - Add KCl to IVF, repeat chem panel in am   4. Prolonged QT interval  - QTc is 507 ms in ED  - Replace potassium, continue cardiac monitoring, minimize QT-prolonging medications    5. History of CVA  - Current medication list not available at time of admission, follow-up pharmacy medication reconciliation    6. Hyperbilirubinemia  - Total bilirubin elevated to 2.6 on admission  - No RUQ tenderness  - Fractionate bili and repeat LFTs tomorrow    DVT prophylaxis: Lovenox  Code Status: Full  Family Communication: Discussed with patient  Disposition Plan:  Patient is from: Group home Anticipated d/c is to: TBD Anticipated d/c date is: Possibly as early as 10/17/19 Patient currently: undergoing workup and treatment of possible sepsis  Consults called: None  Admission status: Observation     Briscoe Deutscher, MD [facility]  10/16/2019, 6:01 AM

## 2019-10-16 NOTE — ED Notes (Signed)
Pt ambulatory to the bathroom with minimal assistance, pt has slow unsteady gait. Pt oriented to person, place and time, resps even and unlabored, pt slightly slow to answer questions, but answers them appropriately.

## 2019-10-16 NOTE — Progress Notes (Signed)
Pharmacy Antibiotic Note  Daniel Wolf is a 64 y.o. male admitted on 10/15/2019 with sepsis.  Pharmacy has been consulted for vancomycin and cefepime dosing. Vancomycin 1gm already given  Plan: Give additional 1gm vancomycin for total 2gm load then 1gm IV q12 Cefepime 2gm IV q8 hours F/u renal function, cultures and clinical course  Height: 5\' 10"  (177.8 cm) Weight: 106.6 kg (235 lb) IBW/kg (Calculated) : 73  Temp (24hrs), Avg:99.7 F (37.6 C), Min:98.3 F (36.8 C), Max:101.1 F (38.4 C)  Recent Labs  Lab 10/16/19 0040 10/16/19 0249  WBC 19.3*  --   CREATININE 0.96  --   LATICACIDVEN 0.9 0.8    Estimated Creatinine Clearance: 97.5 mL/min (by C-G formula based on SCr of 0.96 mg/dL).    No Known Allergies  Thank you for allowing pharmacy to be a part of this patient's care.  12/16/19 [place] 10/16/2019 6:09 AM

## 2019-10-17 DIAGNOSIS — D696 Thrombocytopenia, unspecified: Secondary | ICD-10-CM | POA: Diagnosis present

## 2019-10-17 DIAGNOSIS — I119 Hypertensive heart disease without heart failure: Secondary | ICD-10-CM | POA: Diagnosis present

## 2019-10-17 DIAGNOSIS — D72829 Elevated white blood cell count, unspecified: Secondary | ICD-10-CM | POA: Diagnosis present

## 2019-10-17 DIAGNOSIS — Z8673 Personal history of transient ischemic attack (TIA), and cerebral infarction without residual deficits: Secondary | ICD-10-CM | POA: Diagnosis not present

## 2019-10-17 DIAGNOSIS — R531 Weakness: Secondary | ICD-10-CM | POA: Diagnosis present

## 2019-10-17 DIAGNOSIS — D649 Anemia, unspecified: Secondary | ICD-10-CM | POA: Diagnosis present

## 2019-10-17 DIAGNOSIS — E876 Hypokalemia: Secondary | ICD-10-CM | POA: Diagnosis present

## 2019-10-17 DIAGNOSIS — R509 Fever, unspecified: Secondary | ICD-10-CM | POA: Diagnosis present

## 2019-10-17 DIAGNOSIS — R9431 Abnormal electrocardiogram [ECG] [EKG]: Secondary | ICD-10-CM | POA: Diagnosis present

## 2019-10-17 DIAGNOSIS — G9341 Metabolic encephalopathy: Secondary | ICD-10-CM | POA: Diagnosis present

## 2019-10-17 DIAGNOSIS — R17 Unspecified jaundice: Secondary | ICD-10-CM | POA: Diagnosis present

## 2019-10-17 DIAGNOSIS — R6510 Systemic inflammatory response syndrome (SIRS) of non-infectious origin without acute organ dysfunction: Secondary | ICD-10-CM | POA: Diagnosis present

## 2019-10-17 DIAGNOSIS — Z20822 Contact with and (suspected) exposure to covid-19: Secondary | ICD-10-CM | POA: Diagnosis present

## 2019-10-17 LAB — COMPREHENSIVE METABOLIC PANEL
ALT: 17 U/L (ref 0–44)
AST: 17 U/L (ref 15–41)
Albumin: 3 g/dL — ABNORMAL LOW (ref 3.5–5.0)
Alkaline Phosphatase: 75 U/L (ref 38–126)
Anion gap: 13 (ref 5–15)
BUN: 11 mg/dL (ref 8–23)
CO2: 22 mmol/L (ref 22–32)
Calcium: 7.8 mg/dL — ABNORMAL LOW (ref 8.9–10.3)
Chloride: 102 mmol/L (ref 98–111)
Creatinine, Ser: 0.8 mg/dL (ref 0.61–1.24)
GFR calc Af Amer: >60 mL/min (ref >60)
GFR calc non Af Amer: >60 mL/min (ref >60)
Glucose, Bld: 132 mg/dL — ABNORMAL HIGH (ref 70–99)
Potassium: 3.2 mmol/L — ABNORMAL LOW (ref 3.5–5.1)
Sodium: 137 mmol/L (ref 135–145)
Total Bilirubin: 1.7 mg/dL — ABNORMAL HIGH (ref 0.3–1.2)
Total Protein: 5.9 g/dL — ABNORMAL LOW (ref 6.5–8.1)

## 2019-10-17 LAB — URINE CULTURE: Culture: NO GROWTH

## 2019-10-17 LAB — HIV ANTIBODY (ROUTINE TESTING W REFLEX): HIV Screen 4th Generation wRfx: NONREACTIVE

## 2019-10-17 LAB — CBC
HCT: 35.7 % — ABNORMAL LOW (ref 39.0–52.0)
Hemoglobin: 11.5 g/dL — ABNORMAL LOW (ref 13.0–17.0)
MCH: 28.9 pg (ref 26.0–34.0)
MCHC: 32.2 g/dL (ref 30.0–36.0)
MCV: 89.7 fL (ref 80.0–100.0)
Platelets: 147 10*3/uL — ABNORMAL LOW (ref 150–400)
RBC: 3.98 MIL/uL — ABNORMAL LOW (ref 4.22–5.81)
RDW: 13.9 % (ref 11.5–15.5)
WBC: 13.1 10*3/uL — ABNORMAL HIGH (ref 4.0–10.5)
nRBC: 0 % (ref 0.0–0.2)

## 2019-10-17 MED ORDER — POTASSIUM CHLORIDE CRYS ER 20 MEQ PO TBCR
40.0000 meq | EXTENDED_RELEASE_TABLET | ORAL | Status: AC
  Administered 2019-10-17: 40 meq via ORAL
  Filled 2019-10-17: qty 2

## 2019-10-17 NOTE — Care Management Obs Status (Signed)
MEDICARE OBSERVATION STATUS NOTIFICATION   Patient Details  Name: Daniel Wolf MRN: [id number] Date of Birth: Jun 15, 1955   Medicare Observation Status Notification Given:  Yes    Corey Harold 10/17/2019, 8:13 AM

## 2019-10-17 NOTE — Progress Notes (Signed)
Patient Demographics:    Daniel Wolf, is a 64 y.o. male, DOB - 1955/07/13, DPO:[id number]  Admit date - 10/15/2019   Admitting Physician Yudith Norlander Mariea Clonts, MD  Outpatient Primary MD for the patient is Patient, No Pcp Per  LOS - 0   Chief Complaint  Patient presents with  . Altered Mental Status        Subjective:    Daniel Wolf today has no further fevers, no emesis,  No chest pain,   Had BM --Less confused -Oral intake is fair -Attempted again to reach granddaughter on 9 08/10/2019 with no success, left voicemail again  Assessment  & Plan :    Principal Problem:   SIRS (systemic inflammatory response syndrome) (HCC) Active Problems:   History of CVA (cerebrovascular accident)   Hypokalemia   Prolonged QT interval   Hyperbilirubinemia   Acute encephalopathy   Acute metabolic encephalopathy   Brief Summary:- 63 y.o.malewith medical history significant forhypertension and history of CVA admitted on 10/16/19 with SIRS--unknown source of infection  A/p 1)SIRS--patient was admitted with fevers, leukocytosis and confusion -UA and chest imaging studies without evidence of infection -COVID-19 negative PCT < 0.10 CRP 18.6 Fibrinogen 664 WBC 19.3>>13.1 --Blood and urine cultures NGTD SIR pathophysiology improving -Okay to continue Iv cefepime  --Okay to stop Flagyl and vancomycin  -If cultures remain negative on 10/18/2019 may DC home with empiric antibiotics orally if he continues to improve clinically especially from a cognitive/mentation standpoint   2) acute metabolic encephalopathy----most likely secondary to #1 above --History of some intermittent confusion at baseline as well as labile emotions --Appears less confused,  -Manage as above #1  3) prolonged QT in the setting of hypokalemia--- mag WNL --Potassium up to 3.2 from 2.8 continue to replace and recheck  4) elevated  bilirubin--bilirubin is down to 1.7 from 2.8 ----LFTs not elevated --repeat CMP in a.m.  5) prior history of CVA--aspirin and Lipitor as ordered  6) acute anemia and acute thrombocytopenia----hemoglobin is down to 11.5 from 13.7, platelets are down to 147 from 177 in the setting of SIR pathophysiology  -monitor closely -No bleeding concerns at this time  7)Generalized Weakness and deconditioning--- PT eval  Disposition/Need for in-Hospital Stay- patient unable to be discharged at this time due to --[facility] pathophysiology requiring IV antibiotics pending culture data----leukocytosis improving but not resolved, -If cultures remain negative on 10/18/2019 may DC home with empiric antibiotics orally if he continues to improve clinically especially from a cognitive/mentation standpoint  Status is: Inpatient  Remains inpatient appropriate because:IV antibiotics   Disposition: The patient is from: Home              Anticipated d/c is to: Home              Anticipated d/c date is: 1 day              Patient currently is not medically stable to d/c. Barriers: Not Clinically Stable- -SIR pathophysiology requiring IV antibiotics  -If cultures remain negative on 10/18/2019 may DC home with empiric antibiotics orally if he continues to improve clinically especially from a cognitive/mentation standpoint  Code Status : full  Family Communication:  -Voicemail for granddaughter- Ms [place]---[phone]  Consults  :  na  DVT  Prophylaxis  :  Lovenox -  SCDs   Lab Results  Component Value Date   PLT 147 (L) 10/17/2019    Inpatient Medications  Scheduled Meds: . aspirin EC  81 mg Oral Q breakfast  . atorvastatin  40 mg Oral Daily  . enoxaparin (LOVENOX) injection  50 mg Subcutaneous Q24H  . potassium chloride  40 mEq Oral Q3H  . sodium chloride flush  3 mL Intravenous Q12H   Continuous Infusions: . ceFEPime (MAXIPIME) IV 2 g (10/17/19 0630)   PRN Meds:.acetaminophen **OR** acetaminophen,  senna-docusate    Anti-infectives (From admission, onward)   Start     Dose/Rate Route Frequency Ordered Stop   10/16/19 1800  vancomycin (VANCOCIN) IVPB 1000 mg/200 mL premix  Status:  Discontinued        1,000 mg 200 mL/hr over 60 Minutes Intravenous Every 12 hours 10/16/19 0611 10/17/19 0910   10/16/19 0630  metroNIDAZOLE (FLAGYL) IVPB 500 mg  Status:  Discontinued        500 mg 100 mL/hr over 60 Minutes Intravenous Every 8 hours 10/16/19 0620 10/17/19 0910   10/16/19 0615  vancomycin (VANCOCIN) IVPB 1000 mg/200 mL premix        1,000 mg 200 mL/hr over 60 Minutes Intravenous  Once 10/16/19 0603 10/16/19 0744   10/16/19 0615  ceFEPIme (MAXIPIME) 2 g in sodium chloride 0.9 % 100 mL IVPB        2 g 200 mL/hr over 30 Minutes Intravenous Every 8 hours 10/16/19 0608     10/16/19 0345  piperacillin-tazobactam (ZOSYN) IVPB 3.375 g        3.375 g 100 mL/hr over 30 Minutes Intravenous  Once 10/16/19 0340 10/16/19 0452   10/16/19 0345  vancomycin (VANCOCIN) IVPB 1000 mg/200 mL premix        1,000 mg 200 mL/hr over 60 Minutes Intravenous  Once 10/16/19 0340 10/16/19 0533        Objective:   Vitals:   10/16/19 1410 10/16/19 1807 10/16/19 2115 10/17/19 0300  BP: (!) 160/79 (!) 148/68 (!) 152/73 133/66  Pulse: 78 72 79 63  Resp: 20 20 18 16   Temp: 99 F (37.2 C) 99 F (37.2 C) 99.3 F (37.4 C) 99.2 F (37.3 C)  TempSrc: Oral Oral    SpO2: 100% 97% 98% 98%  Weight: 104.3 kg     Height: 5\' 11"  (1.803 m)       Wt Readings from Last 3 Encounters:  10/16/19 104.3 kg     Intake/Output Summary (Last 24 hours) at 10/17/2019 1331 Last data filed at 10/17/2019 0533 Gross per 24 hour  Intake 1540 ml  Output 452 ml  Net 1088 ml    Physical Exam  Gen:- Awake Alert, less confused HEENT:- .AT, No sclera icterus Neck-Supple Neck,No JVD,.  Lungs-  CTAB , fair symmetrical air movement CV- S1, S2 normal, regular  Abd-  +ve B.Sounds, Abd Soft, No tenderness,    Extremity/Skin:- No   edema, pedal pulses present  Psych-less confused, cooperative  neuro-generalized weakness, no new focal deficits, no tremors   Data Review:   Micro Results Recent Results (from the past 240 hour(s))  SARS Coronavirus 2 by RT PCR (hospital order, [facility]) Nasopharyngeal Nasopharyngeal Swab     Status: None   Collection Time: 10/16/19 12:40 AM   Specimen: Nasopharyngeal Swab  Result Value Ref Range Status   SARS Coronavirus 2 NEGATIVE NEGATIVE Final    Comment: (NOTE) SARS-CoV-2 target nucleic acids  are NOT DETECTED.  The SARS-CoV-2 RNA is generally detectable in upper and lower respiratory specimens during the acute phase of infection. The lowest concentration of SARS-CoV-2 viral copies this assay can detect is 250 copies / mL. A negative result does not preclude SARS-CoV-2 infection and should not be used as the sole basis for treatment or other patient management decisions.  A negative result may occur with improper specimen collection / handling, submission of specimen other than nasopharyngeal swab, presence of viral mutation(s) within the areas targeted by this assay, and inadequate number of viral copies (<250 copies / mL). A negative result must be combined with clinical observations, patient history, and epidemiological information.  Fact Sheet for Patients:   BoilerBrush.com.cy  Fact Sheet for Healthcare Providers: https://pope.com/  This test is not yet approved or  cleared by the [place] FDA and has been authorized for detection and/or diagnosis of SARS-CoV-2 by FDA under an Emergency Use Authorization (EUA).  This EUA will remain in effect (meaning this test can be used) for the duration of the COVID-19 declaration under Section 564(b)(1) of the Act, 21 U.S.C. section 360bbb-3(b)(1), unless the authorization is terminated or revoked sooner.  Performed at [facility], [address] [place], [place] [address]   Blood Culture (routine x 2)     Status: None (Preliminary result)   Collection Time: 10/16/19 12:40 AM   Specimen: Left Antecubital; Blood  Result Value Ref Range Status   Specimen Description LEFT ANTECUBITAL  Final   Special Requests   Final    BOTTLES DRAWN AEROBIC AND ANAEROBIC Blood Culture adequate volume   Culture   Final    NO GROWTH 1 DAY Performed at [facility], [address] [place], [place] [address]    Report Status PENDING  Incomplete  Blood Culture (routine x 2)     Status: None (Preliminary result)   Collection Time: 10/16/19  2:49 AM   Specimen: Left Antecubital; Blood  Result Value Ref Range Status   Specimen Description LEFT ANTECUBITAL  Final   Special Requests   Final    BOTTLES DRAWN AEROBIC AND ANAEROBIC Blood Culture adequate volume   Culture   Final    NO GROWTH 1 DAY Performed at [facility], [address] [place], [place] [address]    Report Status PENDING  Incomplete  Urine culture     Status: None   Collection Time: 10/16/19  4:20 AM   Specimen: Urine, Clean Catch  Result Value Ref Range Status   Specimen Description   Final    URINE, CLEAN CATCH Performed at [facility], [address] [place], [place] [address]    Special Requests   Final    NONE Performed at [facility], [address], [place], [place] [address]    Culture   Final    NO GROWTH Performed at [facility], [address], [place], [place] [address]    Report Status 10/17/2019 FINAL  Final    Radiology Reports DG Chest Port 1 View  Result Date: 10/16/2019 CLINICAL DATA:  Fever, cough, and confusion. EXAM: PORTABLE CHEST 1 VIEW COMPARISON:  None. FINDINGS: Mild cardiac enlargement. No edema, consolidation, or effusion. Mediastinal contours appear intact. IMPRESSION: Mild cardiac enlargement. No evidence of active pulmonary disease. Electronically Signed   By: Burman Nieves M.D.   On: 10/16/2019 01:32     CBC Recent Labs   Lab 10/16/19 0040 10/17/19 0341  WBC 19.3* 13.1*  HGB 13.7 11.5*  HCT 40.6 35.7*  PLT 177 147*  MCV 88.1 89.7  MCH 29.7 28.9  MCHC 33.7 32.2  RDW 13.7 13.9  LYMPHSABS 1.9  --   MONOABS 1.5*  --   EOSABS 0.0  --   BASOSABS 0.0  --     Chemistries  Recent Labs  Lab 10/16/19 0040 10/16/19 0249 10/17/19 0341  NA 132*  --  137  K 2.8*  --  3.2*  CL 95*  --  102  CO2 26  --  22  GLUCOSE 142*  --  132*  BUN 13  --  11  CREATININE 0.96  --  0.80  CALCIUM 8.0*  --  7.8*  MG 1.8  --   --   AST 15  --  17  ALT 15  --  17  ALKPHOS 99  --  75  BILITOT 2.6* 2.8* 1.7*   ------------------------------------------------------------------------------------------------------------------ Recent Labs    10/16/19 0040  TRIG 75    No results found for: HGBA1C ------------------------------------------------------------------------------------------------------------------ No results for input(s): TSH, T4TOTAL, T3FREE, THYROIDAB in the last 72 hours.  Invalid input(s): FREET3 ------------------------------------------------------------------------------------------------------------------ Recent Labs    10/16/19 0040  FERRITIN 283    Coagulation profile No results for input(s): INR, PROTIME in the last 168 hours.  Recent Labs    10/16/19 0040  DDIMER 0.51*    Cardiac Enzymes No results for input(s): CKMB, TROPONINI, MYOGLOBIN in the last 168 hours.  Invalid input(s): CK ------------------------------------------------------------------------------------------------------------------ No results found for: BNP   Shon Hale M.D on 10/17/2019 at 1:31 PM  Go to [email or web address] - for contact info  [facility] - Office  [phone]

## 2019-10-17 NOTE — Evaluation (Signed)
Physical Therapy Evaluation Patient Details Name: Daniel Wolf MRN: [id number] DOB: 24-Dec-1955 Today's Date: 10/17/2019   History of Present Illness  64 y.o. male with medical history significant for HTN and history of CVA, now presenting to the emergency department for evaluation of fever and altered mental status.  Clinical Impression  Pt admitted with above diagnosis. PTA, pt independent with ADLs, community ambulation without AD, works as Development worker, community at [employer], drives, and denies recent falls. Pt able to come to EOB and ambulate around hospital floor, navigating sloped surfaces with slight cadence change and balance challenge, but no overt LOB. Pt alert and oriented to self, month/year and location, but appears to have some confusion regarding situation. Pt currently with functional limitations due to the deficits listed below (see PT Problem List). Pt will benefit from skilled PT to increase their independence and safety with mobility to allow discharge to the venue listed below.       Follow Up Recommendations No PT follow up    Equipment Recommendations  None recommended by PT    Recommendations for Other Services       Precautions / Restrictions Precautions Precautions: None Restrictions Weight Bearing Restrictions: No      Mobility  Bed Mobility Overal bed mobility: Independent  General bed mobility comments: supine<>sit without assist or extra time  Transfers Overall transfer level: Independent Equipment used: None  General transfer comment: STS from EOB with UE assisting, steady upon standing  Ambulation/Gait Ambulation/Gait assistance: Min guard;Supervision Gait Distance (Feet): 150 Feet Assistive device: None Gait Pattern/deviations: WFL(Within Functional Limits)     General Gait Details: pt ambulates with increased lateral weight-shifting and decreased bil arm swing, good bil foot clearance, equal step length, able to ascend/descend  incline/decline surfaces without LOB  Stairs            Wheelchair Mobility    Modified Rankin (Stroke Patients Only)       Balance Overall balance assessment: No apparent balance deficits (not formally assessed)            Pertinent Vitals/Pain Pain Assessment: No/denies pain    Home Living Family/patient expects to be discharged to:: Private residence Living Arrangements: Other (Comment) (granddaughter) Available Help at Discharge: Family;Friend(s);Available PRN/intermittently Type of Home: House Home Access: Stairs to enter Entrance Stairs-Rails: None Entrance Stairs-Number of Steps: 2 Home Layout: One level        Prior Function Level of Independence: Independent    Comments: Pt reports independent with ADLs, community ambulation without AD, still drives. Pt denies recent falls. Pt working as Consulting civil engineer at [employer].     Hand Dominance        Extremity/Trunk Assessment   Upper Extremity Assessment Upper Extremity Assessment: Overall WFL for tasks assessed    Lower Extremity Assessment Lower Extremity Assessment: Overall WFL for tasks assessed (symmetrical, AROM WNL, strength 5/5)    Cervical / Trunk Assessment Cervical / Trunk Assessment: Normal  Communication   Communication: No difficulties  Cognition Arousal/Alertness: Awake/alert Behavior During Therapy: WFL for tasks assessed/performed;Flat affect Overall Cognitive Status: Within Functional Limits for tasks assessed  General Comments: Pt alert and oriented to self, month/year, location. Pt unaware of situation, reports "I think something was wrong with my heart".      General Comments      Exercises     Assessment/Plan    PT Assessment Patient needs continued PT services  PT Problem List Decreased activity tolerance;Decreased cognition;Decreased safety awareness  PT Treatment Interventions DME instruction;Gait training;Stair training;Functional mobility  training;Therapeutic activities;Therapeutic exercise;Balance training;Neuromuscular re-education;Patient/family education    PT Goals (Current goals can be found in the Care Plan section)  Acute Rehab PT Goals Patient Stated Goal: return home PT Goal Formulation: With patient Time For Goal Achievement: 10/24/19 Potential to Achieve Goals: Good    Frequency Min 3X/week   Barriers to discharge        Co-evaluation               AM-PAC PT "6 Clicks" Mobility  Outcome Measure Help needed turning from your back to your side while in a flat bed without using bedrails?: None Help needed moving from lying on your back to sitting on the side of a flat bed without using bedrails?: None Help needed moving to and from a bed to a chair (including a wheelchair)?: None Help needed standing up from a chair using your arms (e.g., wheelchair or bedside chair)?: None Help needed to walk in hospital room?: None Help needed climbing 3-5 steps with a railing? : None 6 Click Score: 24    End of Session Equipment Utilized During Treatment: Gait belt Activity Tolerance: Patient tolerated treatment well Patient left: in bed;with call bell/phone within reach Nurse Communication: Mobility status PT Visit Diagnosis: Other abnormalities of gait and mobility (R26.89)    Time: 1443-1540 PT Time Calculation (min) (ACUTE ONLY): 15 min   Charges:   PT Evaluation $PT Eval Low Complexity: 1 Low           Tori Safal Halderman PT, DPT 10/17/19, 3:41 PM [phone]

## 2019-10-17 NOTE — Plan of Care (Signed)
  Problem: Acute Rehab PT Goals(only PT should resolve) Goal: Pt Will Ambulate Outcome: Progressing Flowsheets (Taken 10/17/2019 1544) Pt will Ambulate:  > 125 feet  Independently Goal: Pt/caregiver will Perform Home Exercise Program Outcome: Progressing Flowsheets (Taken 10/17/2019 1544) Pt/caregiver will Perform Home Exercise Program: Independently   Domenick Bookbinder PT, DPT 10/17/19, 3:45 PM [phone]

## 2019-10-18 LAB — COMPREHENSIVE METABOLIC PANEL
ALT: 22 U/L (ref 0–44)
AST: 22 U/L (ref 15–41)
Albumin: 3 g/dL — ABNORMAL LOW (ref 3.5–5.0)
Alkaline Phosphatase: 78 U/L (ref 38–126)
Anion gap: 10 (ref 5–15)
BUN: 12 mg/dL (ref 8–23)
CO2: 27 mmol/L (ref 22–32)
Calcium: 8.2 mg/dL — ABNORMAL LOW (ref 8.9–10.3)
Chloride: 101 mmol/L (ref 98–111)
Creatinine, Ser: 0.7 mg/dL (ref 0.61–1.24)
GFR calc Af Amer: >60 mL/min (ref >60)
GFR calc non Af Amer: >60 mL/min (ref >60)
Glucose, Bld: 128 mg/dL — ABNORMAL HIGH (ref 70–99)
Potassium: 3 mmol/L — ABNORMAL LOW (ref 3.5–5.1)
Sodium: 138 mmol/L (ref 135–145)
Total Bilirubin: 1.7 mg/dL — ABNORMAL HIGH (ref 0.3–1.2)
Total Protein: 6.1 g/dL — ABNORMAL LOW (ref 6.5–8.1)

## 2019-10-18 LAB — CBC
HCT: 35.4 % — ABNORMAL LOW (ref 39.0–52.0)
Hemoglobin: 11.7 g/dL — ABNORMAL LOW (ref 13.0–17.0)
MCH: 29 pg (ref 26.0–34.0)
MCHC: 33.1 g/dL (ref 30.0–36.0)
MCV: 87.6 fL (ref 80.0–100.0)
Platelets: 145 10*3/uL — ABNORMAL LOW (ref 150–400)
RBC: 4.04 MIL/uL — ABNORMAL LOW (ref 4.22–5.81)
RDW: 13.8 % (ref 11.5–15.5)
WBC: 8.6 10*3/uL (ref 4.0–10.5)
nRBC: 0 % (ref 0.0–0.2)

## 2019-10-18 LAB — MAGNESIUM: Magnesium: 2.1 mg/dL (ref 1.7–2.4)

## 2019-10-18 MED ORDER — CEFDINIR 300 MG PO CAPS: 300.0000 mg | ORAL_CAPSULE | Freq: Two times a day (BID) | ORAL | 0 refills | Status: AC

## 2019-10-18 MED ORDER — POTASSIUM CHLORIDE CRYS ER 20 MEQ PO TBCR
40.0000 meq | EXTENDED_RELEASE_TABLET | ORAL | Status: AC
  Administered 2019-10-18 (×2): 40 meq via ORAL
  Filled 2019-10-18 (×2): qty 2

## 2019-10-18 MED ORDER — ACETAMINOPHEN 325 MG PO TABS
650.0000 mg | ORAL_TABLET | Freq: Four times a day (QID) | ORAL | 0 refills | Status: DC | PRN
Start: 2019-10-18 — End: 2023-10-25

## 2019-10-18 MED ORDER — ASPIRIN 81 MG PO TBEC: 81.0000 mg | DELAYED_RELEASE_TABLET | Freq: Every day | ORAL | 11 refills | Status: AC

## 2019-10-18 NOTE — Care Management Important Message (Signed)
Important Message  Patient Details  Name: Daniel Wolf MRN: [id number] Date of Birth: 01/09/1956   Medicare Important Message Given:  Yes     Corey Harold 10/18/2019, 1:34 PM

## 2019-10-18 NOTE — Discharge Summary (Signed)
Daniel Wolf, is a 64 y.o. male  DOB [birth year]  MRN [id number].  Admission date:  10/15/2019  Admitting Physician  Shon Hale, MD  Discharge Date:  10/18/2019   Primary MD  Patient, No Pcp Per  Recommendations for primary care physician for things to follow:   1) please take Omnicef as prescribed for bacterial infection 2) follow-up with the primary care physician in about a week or so for recheck and reevaluation including repeat CBC and BMP blood test  Admission Diagnosis  Hypokalemia [E87.6] SIRS (systemic inflammatory response syndrome) (HCC) [R65.10] Fever, unspecified fever cause [R50.9] Acute metabolic encephalopathy [G93.41]   Discharge Diagnosis  Hypokalemia [E87.6] SIRS (systemic inflammatory response syndrome) (HCC) [R65.10] Fever, unspecified fever cause [R50.9] Acute metabolic encephalopathy [G93.41]   Principal Problem:   SIRS (systemic inflammatory response syndrome) (HCC) Active Problems:   History of CVA (cerebrovascular accident)   Hypokalemia   Prolonged QT interval   Hyperbilirubinemia   Acute encephalopathy   Acute metabolic encephalopathy      Past Medical History:  Diagnosis Date  . CVA (cerebral vascular accident) (HCC)   . [facility] stroke [facility])     History reviewed. No pertinent surgical history.   HPI  from the history and physical done on the day of admission:   Patient coming from: Home   Chief Complaint: Fever, confusion   HPI: Daniel Wolf is a 64 y.o. male with medical history significant for hypertension and history of CVA, now presenting to the emergency department for evaluation of fever and altered mental status.  EMS was called due to confusion, patient was found to be febrile, and brought into the ED for further evaluation.  He was reportedly confused initially on arrival to the ED, but this improved significantly with treatment of his fever  and he was able to provide history, denying any recent cough, abdominal pain, dysuria, flank pain, neck stiffness, or headache.  Reports that he felt okay yesterday.  ED Course: Upon arrival to the ED, patient is found to be febrile to 38.4 C, saturating well on room air, and with normal heart rate, respiratory rate, and blood pressure.  EKG features sinus rhythm with PACs and QTc interval of 507 ms.  Chest x-ray features mild cardiomegaly but no acute cardiopulmonary disease.  Chemistry panel demonstrates hypokalemia with potassium 2.8 as well as elevation in total bilirubin to 2.6.  CBC notable for leukocytosis to 19,300.  Lactic acid reassuringly normal.  Covid PCR is negative.  D-dimer 0.51, procalcitonin undetectable, and CRP elevated to 18.6.  Blood and urine cultures were collected in the emergency department, 2 L of saline were administered, and the patient was treated with Tylenol, vancomycin, Zosyn, and potassium.    Hospital Course:    Brief Summary:- 64 y.o.malewith medical history significant forhypertension and history of CVAadmitted on 10/16/19 with SIRS--unknown source of infection  A/p 1)SIRS--patient was admitted with fevers, leukocytosis and confusion -UA and chest imaging studies without evidence of infection -COVID-19 negative PCT < 0.10 CRP 18.6 Fibrinogen  664 WBC 19.3>>13.1 --Blood and urine cultures NGTD SIR pathophysiology has Resolved -Treated with Iv cefepime  -- stopped Flagyl and vancomycin  --dc home on omnicef 300mg  bid x 5 days   2)Acute metabolic encephalopathy----most likely secondary to #1 above --History of some intermittent confusion at baseline as well as labile emotions -According to patient's granddaughter, Daniel Wolf patient appears to be back to baseline Confusion and encephalopathy has resolved -Manage as above #1  3)Prolonged QT in the setting of hypokalemia--- mag WNL -Potassium was replaced  4)elevated bilirubin--bilirubin is down to  1.7 from 2.8 ----LFTs not elevated -  5)prior history of CVA--aspirin and Lipitor advised  6) acute anemia and acute thrombocytopenia----hemoglobin is down to 11.7 from 13.7, platelets are down to 145 from 177 in the setting of SIR pathophysiology  -No bleeding concerns at this time  7)Generalized Weakness and deconditioning--- PT eval appreciated recommends no further PT  Disposition-discharge home with granddaughter  Code Status : full  Family Communication:  -Discussed with  granddaughter- Ms CamrynWest---[phone]  Consults  :  na  Discharge Condition: stable  Follow UP--PCP as advised  Diet and Activity recommendation:  As advised  Discharge Instructions    Discharge Instructions    Call MD for:  difficulty breathing, headache or visual disturbances   Complete by: As directed    Call MD for:  persistant dizziness or light-headedness   Complete by: As directed    Call MD for:  persistant nausea and vomiting   Complete by: As directed    Call MD for:  severe uncontrolled pain   Complete by: As directed    Call MD for:  temperature >100.4   Complete by: As directed    Diet - low sodium heart healthy   Complete by: As directed    Discharge instructions   Complete by: As directed    1) please take Omnicef as prescribed for bacterial infection 2) follow-up with the primary care physician in about a week or so for recheck and reevaluation including repeat CBC and BMP blood test   Increase activity slowly   Complete by: As directed       Discharge Medications     Allergies as of 10/18/2019   No Known Allergies     Medication List    STOP taking these medications   predniSONE 20 MG tablet Commonly known as: DELTASONE     TAKE these medications   acetaminophen 325 MG tablet Commonly known as: TYLENOL Take 2 tablets (650 mg total) by mouth every 6 (six) hours as needed for mild pain (or Fever >/= 101).   aspirin 81 MG EC tablet Take 1 tablet (81  mg total) by mouth daily with breakfast. Swallow whole.   atorvastatin 80 MG tablet Commonly known as: LIPITOR Take 80 mg by mouth daily.   cefdinir 300 MG capsule Commonly known as: OMNICEF Take 1 capsule (300 mg total) by mouth 2 (two) times daily for 5 days.   fluticasone 0.005 % ointment Commonly known as: CUTIVATE Apply 1 application topically 2 (two) times daily as needed.   ketoconazole 2 % cream Commonly known as: NIZORAL Apply 1 application topically. Sparingly daily      Major procedures and Radiology Reports - PLEASE review detailed and final reports for all details, in brief -    DG Chest Port 1 View  Result Date: 10/16/2019 CLINICAL DATA:  Fever, cough, and confusion. EXAM: PORTABLE CHEST 1 VIEW COMPARISON:  None. FINDINGS: Mild cardiac enlargement. No edema, consolidation,  or effusion. Mediastinal contours appear intact. IMPRESSION: Mild cardiac enlargement. No evidence of active pulmonary disease. Electronically Signed   By: Burman NievesWilliam  Stevens M.D.   On: 10/16/2019 01:32    Micro Results   Recent Results (from the past 240 hour(s))  SARS Coronavirus 2 by RT PCR (hospital order, performed in [facility] hospital lab) Nasopharyngeal Nasopharyngeal Swab     Status: None   Collection Time: 10/16/19 12:40 AM   Specimen: Nasopharyngeal Swab  Result Value Ref Range Status   SARS Coronavirus 2 NEGATIVE NEGATIVE Final    Comment: (NOTE) SARS-CoV-2 target nucleic acids are NOT DETECTED.  The SARS-CoV-2 RNA is generally detectable in upper and lower respiratory specimens during the acute phase of infection. The lowest concentration of SARS-CoV-2 viral copies this assay can detect is 250 copies / mL. A negative result does not preclude SARS-CoV-2 infection and should not be used as the sole basis for treatment or other patient management decisions.  A negative result may occur with improper specimen collection / handling, submission of specimen other than nasopharyngeal  swab, presence of viral mutation(s) within the areas targeted by this assay, and inadequate number of viral copies (<250 copies / mL). A negative result must be combined with clinical observations, patient history, and epidemiological information.  Fact Sheet for Patients:   BoilerBrush.com.cyhttps://www.fda.gov/media/136312/download  Fact Sheet for Healthcare Providers: https://pope.com/https://www.fda.gov/media/136313/download  This test is not yet approved or  cleared by the [place] FDA and has been authorized for detection and/or diagnosis of SARS-CoV-2 by FDA under an Emergency Use Authorization (EUA).  This EUA will remain in effect (meaning this test can be used) for the duration of the COVID-19 declaration under Section 564(b)(1) of the Act, 21 U.S.C. section 360bbb-3(b)(1), unless the authorization is terminated or revoked sooner.  Performed at [facility], [address] [place], [place] [address]   Blood Culture (routine x 2)     Status: None (Preliminary result)   Collection Time: 10/16/19 12:40 AM   Specimen: Left Antecubital; Blood  Result Value Ref Range Status   Specimen Description LEFT ANTECUBITAL  Final   Special Requests   Final    BOTTLES DRAWN AEROBIC AND ANAEROBIC Blood Culture adequate volume   Culture   Final    NO GROWTH 2 DAYS Performed at [facility], [address] [place], [place] [address]    Report Status PENDING  Incomplete  Blood Culture (routine x 2)     Status: None (Preliminary result)   Collection Time: 10/16/19  2:49 AM   Specimen: Left Antecubital; Blood  Result Value Ref Range Status   Specimen Description LEFT ANTECUBITAL  Final   Special Requests   Final    BOTTLES DRAWN AEROBIC AND ANAEROBIC Blood Culture adequate volume   Culture   Final    NO GROWTH 2 DAYS Performed at [facility], [address] [place], [place] [address]    Report Status PENDING  Incomplete  Urine culture     Status: None   Collection Time: 10/16/19  4:20 AM   Specimen: Urine,  Clean Catch  Result Value Ref Range Status   Specimen Description   Final    URINE, CLEAN CATCH Performed at [facility], [address] [place], [place] [address]    Special Requests   Final    NONE Performed at [facility], [address] [place], [place] [address]    Culture   Final    NO GROWTH Performed at [facility], [address], [place], [place] [address]    Report Status 10/17/2019 FINAL  Final    Today   Subjective    Daniel Wolf today has no new concerns   No Nausea, Vomiting or Diarrhea No fever  Or chills    Patient has been seen and examined prior to discharge   Objective   Blood pressure (!) 155/59, pulse (!) 59, temperature 99 F (37.2 C), resp. rate 19, height 5\' 11"  (1.803 m), weight 104.3 kg, SpO2 98 %.   Intake/Output Summary (Last 24 hours) at 10/18/2019 1109 Last data filed at 10/18/2019 0900 Gross per 24 hour  Intake 240 ml  Output --  Net 240 ml   Exam Gen:- Awake Alert,  coherent and cooperative HEENT:- [place].AT, No sclera icterus Neck-Supple Neck,No JVD,.  Lungs-  CTAB , fair symmetrical air movement CV- S1, S2 normal, regular  Abd-  +ve B.Sounds, Abd Soft, No tenderness,    Extremity/Skin:- No  edema, pedal pulses present  Psych-affect is appropriate, alert and oriented x3  neuro-  no new focal deficits, no tremors   Data Review   CBC w Diff:  Lab Results  Component Value Date   WBC 8.6 10/18/2019   HGB 11.7 (L) 10/18/2019   HCT 35.4 (L) 10/18/2019   PLT 145 (L) 10/18/2019   LYMPHOPCT 10 10/16/2019   MONOPCT 8 10/16/2019   EOSPCT 0 10/16/2019   BASOPCT 0 10/16/2019    CMP:  Lab Results  Component Value Date   NA 138 10/18/2019   K 3.0 (L) 10/18/2019   CL 101 10/18/2019   CO2 27 10/18/2019   BUN 12 10/18/2019   CREATININE 0.70 10/18/2019   PROT 6.1 (L) 10/18/2019   ALBUMIN 3.0 (L) 10/18/2019   BILITOT 1.7 (H) 10/18/2019   ALKPHOS 78 10/18/2019   AST 22 10/18/2019   ALT 22 10/18/2019   Total Discharge  time is about 33 minutes  12/18/2019 M.D on 10/18/2019 at 11:09 AM  Go to [email or web address] -  for contact info  [facility] - Office  [phone]

## 2019-10-18 NOTE — Plan of Care (Signed)

## 2019-10-18 NOTE — Discharge Instructions (Signed)
1) please take Omnicef as prescribed for bacterial infection 2) follow-up with the primary care physician in about a week or so for recheck and reevaluation including repeat CBC and BMP blood test

## 2019-10-21 LAB — CULTURE, BLOOD (ROUTINE X 2)
Culture: NO GROWTH
Culture: NO GROWTH
Special Requests: ADEQUATE
Special Requests: ADEQUATE

## 2021-02-17 DIAGNOSIS — H538 Other visual disturbances: Secondary | ICD-10-CM | POA: Diagnosis not present

## 2021-02-17 DIAGNOSIS — Z8673 Personal history of transient ischemic attack (TIA), and cerebral infarction without residual deficits: Secondary | ICD-10-CM | POA: Diagnosis not present

## 2021-02-17 DIAGNOSIS — E7801 Familial hypercholesterolemia: Secondary | ICD-10-CM | POA: Diagnosis not present

## 2021-02-17 DIAGNOSIS — F32A Depression, unspecified: Secondary | ICD-10-CM | POA: Diagnosis not present

## 2021-02-17 DIAGNOSIS — Z6833 Body mass index (BMI) 33.0-33.9, adult: Secondary | ICD-10-CM | POA: Diagnosis not present

## 2021-02-17 DIAGNOSIS — I10 Essential (primary) hypertension: Secondary | ICD-10-CM | POA: Diagnosis not present

## 2021-02-26 DIAGNOSIS — Z8673 Personal history of transient ischemic attack (TIA), and cerebral infarction without residual deficits: Secondary | ICD-10-CM | POA: Diagnosis not present

## 2021-02-26 DIAGNOSIS — I6389 Other cerebral infarction: Secondary | ICD-10-CM | POA: Diagnosis not present

## 2021-02-26 DIAGNOSIS — D7589 Other specified diseases of blood and blood-forming organs: Secondary | ICD-10-CM | POA: Diagnosis not present

## 2021-03-06 DIAGNOSIS — Z8673 Personal history of transient ischemic attack (TIA), and cerebral infarction without residual deficits: Secondary | ICD-10-CM | POA: Diagnosis not present

## 2021-03-06 DIAGNOSIS — I6389 Other cerebral infarction: Secondary | ICD-10-CM | POA: Diagnosis not present

## 2021-03-06 DIAGNOSIS — D7589 Other specified diseases of blood and blood-forming organs: Secondary | ICD-10-CM | POA: Diagnosis not present

## 2021-03-07 DIAGNOSIS — Z8673 Personal history of transient ischemic attack (TIA), and cerebral infarction without residual deficits: Secondary | ICD-10-CM | POA: Diagnosis not present

## 2021-03-07 DIAGNOSIS — D7589 Other specified diseases of blood and blood-forming organs: Secondary | ICD-10-CM | POA: Diagnosis not present

## 2021-03-17 DIAGNOSIS — E7801 Familial hypercholesterolemia: Secondary | ICD-10-CM | POA: Diagnosis not present

## 2021-03-17 DIAGNOSIS — H538 Other visual disturbances: Secondary | ICD-10-CM | POA: Diagnosis not present

## 2021-03-17 DIAGNOSIS — I10 Essential (primary) hypertension: Secondary | ICD-10-CM | POA: Diagnosis not present

## 2021-03-17 DIAGNOSIS — Z1331 Encounter for screening for depression: Secondary | ICD-10-CM | POA: Diagnosis not present

## 2021-03-17 DIAGNOSIS — Z8673 Personal history of transient ischemic attack (TIA), and cerebral infarction without residual deficits: Secondary | ICD-10-CM | POA: Diagnosis not present

## 2021-03-17 DIAGNOSIS — Z1389 Encounter for screening for other disorder: Secondary | ICD-10-CM | POA: Diagnosis not present

## 2021-03-17 DIAGNOSIS — F32A Depression, unspecified: Secondary | ICD-10-CM | POA: Diagnosis not present

## 2021-03-27 DIAGNOSIS — Z8673 Personal history of transient ischemic attack (TIA), and cerebral infarction without residual deficits: Secondary | ICD-10-CM | POA: Diagnosis not present

## 2021-03-27 DIAGNOSIS — E7801 Familial hypercholesterolemia: Secondary | ICD-10-CM | POA: Diagnosis not present

## 2021-03-27 DIAGNOSIS — I10 Essential (primary) hypertension: Secondary | ICD-10-CM | POA: Diagnosis not present

## 2021-03-27 DIAGNOSIS — D6861 Antiphospholipid syndrome: Secondary | ICD-10-CM | POA: Diagnosis not present

## 2021-03-27 DIAGNOSIS — Z6833 Body mass index (BMI) 33.0-33.9, adult: Secondary | ICD-10-CM | POA: Diagnosis not present

## 2021-03-27 DIAGNOSIS — F32A Depression, unspecified: Secondary | ICD-10-CM | POA: Diagnosis not present

## 2021-03-27 DIAGNOSIS — I8290 Acute embolism and thrombosis of unspecified vein: Secondary | ICD-10-CM | POA: Diagnosis not present

## 2021-03-27 DIAGNOSIS — H538 Other visual disturbances: Secondary | ICD-10-CM | POA: Diagnosis not present

## 2021-04-04 DIAGNOSIS — I8290 Acute embolism and thrombosis of unspecified vein: Secondary | ICD-10-CM | POA: Diagnosis not present

## 2021-04-30 DIAGNOSIS — D6861 Antiphospholipid syndrome: Secondary | ICD-10-CM | POA: Diagnosis not present

## 2021-04-30 DIAGNOSIS — I8290 Acute embolism and thrombosis of unspecified vein: Secondary | ICD-10-CM | POA: Diagnosis not present

## 2021-05-21 DIAGNOSIS — D6861 Antiphospholipid syndrome: Secondary | ICD-10-CM | POA: Diagnosis not present

## 2021-05-26 DIAGNOSIS — H47013 Ischemic optic neuropathy, bilateral: Secondary | ICD-10-CM | POA: Diagnosis not present

## 2021-05-26 DIAGNOSIS — H25813 Combined forms of age-related cataract, bilateral: Secondary | ICD-10-CM | POA: Diagnosis not present

## 2021-06-02 DIAGNOSIS — L03116 Cellulitis of left lower limb: Secondary | ICD-10-CM | POA: Diagnosis not present

## 2021-06-02 DIAGNOSIS — D6861 Antiphospholipid syndrome: Secondary | ICD-10-CM | POA: Diagnosis not present

## 2021-06-10 DIAGNOSIS — D6861 Antiphospholipid syndrome: Secondary | ICD-10-CM | POA: Diagnosis not present

## 2021-06-26 DIAGNOSIS — L03116 Cellulitis of left lower limb: Secondary | ICD-10-CM | POA: Diagnosis not present

## 2021-06-26 DIAGNOSIS — F32A Depression, unspecified: Secondary | ICD-10-CM | POA: Diagnosis not present

## 2021-06-26 DIAGNOSIS — E7801 Familial hypercholesterolemia: Secondary | ICD-10-CM | POA: Diagnosis not present

## 2021-06-26 DIAGNOSIS — I10 Essential (primary) hypertension: Secondary | ICD-10-CM | POA: Diagnosis not present

## 2021-06-26 DIAGNOSIS — Z8673 Personal history of transient ischemic attack (TIA), and cerebral infarction without residual deficits: Secondary | ICD-10-CM | POA: Diagnosis not present

## 2021-06-26 DIAGNOSIS — Z0001 Encounter for general adult medical examination with abnormal findings: Secondary | ICD-10-CM | POA: Diagnosis not present

## 2021-06-26 DIAGNOSIS — H538 Other visual disturbances: Secondary | ICD-10-CM | POA: Diagnosis not present

## 2021-06-26 DIAGNOSIS — Z6833 Body mass index (BMI) 33.0-33.9, adult: Secondary | ICD-10-CM | POA: Diagnosis not present

## 2021-06-26 DIAGNOSIS — D6861 Antiphospholipid syndrome: Secondary | ICD-10-CM | POA: Diagnosis not present

## 2021-07-10 DIAGNOSIS — Z5181 Encounter for therapeutic drug level monitoring: Secondary | ICD-10-CM | POA: Diagnosis not present

## 2021-07-10 DIAGNOSIS — D6861 Antiphospholipid syndrome: Secondary | ICD-10-CM | POA: Diagnosis not present

## 2021-07-10 DIAGNOSIS — I10 Essential (primary) hypertension: Secondary | ICD-10-CM | POA: Diagnosis not present

## 2021-07-10 DIAGNOSIS — Z7901 Long term (current) use of anticoagulants: Secondary | ICD-10-CM | POA: Diagnosis not present

## 2021-07-24 DIAGNOSIS — Z5181 Encounter for therapeutic drug level monitoring: Secondary | ICD-10-CM | POA: Diagnosis not present

## 2021-07-24 DIAGNOSIS — Z7901 Long term (current) use of anticoagulants: Secondary | ICD-10-CM | POA: Diagnosis not present

## 2021-07-24 DIAGNOSIS — D6861 Antiphospholipid syndrome: Secondary | ICD-10-CM | POA: Diagnosis not present

## 2021-08-07 DIAGNOSIS — D6861 Antiphospholipid syndrome: Secondary | ICD-10-CM | POA: Diagnosis not present

## 2021-08-26 DIAGNOSIS — Z7901 Long term (current) use of anticoagulants: Secondary | ICD-10-CM | POA: Diagnosis not present

## 2021-08-26 DIAGNOSIS — D6861 Antiphospholipid syndrome: Secondary | ICD-10-CM | POA: Diagnosis not present

## 2021-08-26 DIAGNOSIS — Z8673 Personal history of transient ischemic attack (TIA), and cerebral infarction without residual deficits: Secondary | ICD-10-CM | POA: Diagnosis not present

## 2021-08-26 DIAGNOSIS — Z5181 Encounter for therapeutic drug level monitoring: Secondary | ICD-10-CM | POA: Diagnosis not present

## 2021-09-09 DIAGNOSIS — D6861 Antiphospholipid syndrome: Secondary | ICD-10-CM | POA: Diagnosis not present

## 2021-09-18 DIAGNOSIS — Z8673 Personal history of transient ischemic attack (TIA), and cerebral infarction without residual deficits: Secondary | ICD-10-CM | POA: Diagnosis not present

## 2021-09-18 DIAGNOSIS — Z131 Encounter for screening for diabetes mellitus: Secondary | ICD-10-CM | POA: Diagnosis not present

## 2021-09-18 DIAGNOSIS — I10 Essential (primary) hypertension: Secondary | ICD-10-CM | POA: Diagnosis not present

## 2021-09-18 DIAGNOSIS — E7801 Familial hypercholesterolemia: Secondary | ICD-10-CM | POA: Diagnosis not present

## 2021-09-18 DIAGNOSIS — Z1329 Encounter for screening for other suspected endocrine disorder: Secondary | ICD-10-CM | POA: Diagnosis not present

## 2021-09-25 DIAGNOSIS — Z8673 Personal history of transient ischemic attack (TIA), and cerebral infarction without residual deficits: Secondary | ICD-10-CM | POA: Diagnosis not present

## 2021-09-25 DIAGNOSIS — H538 Other visual disturbances: Secondary | ICD-10-CM | POA: Diagnosis not present

## 2021-09-25 DIAGNOSIS — Z6833 Body mass index (BMI) 33.0-33.9, adult: Secondary | ICD-10-CM | POA: Diagnosis not present

## 2021-09-25 DIAGNOSIS — F32A Depression, unspecified: Secondary | ICD-10-CM | POA: Diagnosis not present

## 2021-09-25 DIAGNOSIS — D6861 Antiphospholipid syndrome: Secondary | ICD-10-CM | POA: Diagnosis not present

## 2021-09-25 DIAGNOSIS — I10 Essential (primary) hypertension: Secondary | ICD-10-CM | POA: Diagnosis not present

## 2021-09-25 DIAGNOSIS — E7801 Familial hypercholesterolemia: Secondary | ICD-10-CM | POA: Diagnosis not present

## 2021-10-04 IMAGING — DX DG CHEST 1V PORT
1 series · 1 of 1 positions shown · non-contrast
Comparison: None.

CLINICAL DATA: Fever, cough, and confusion.

EXAM:
PORTABLE CHEST 1 VIEW

[chest ap grid]
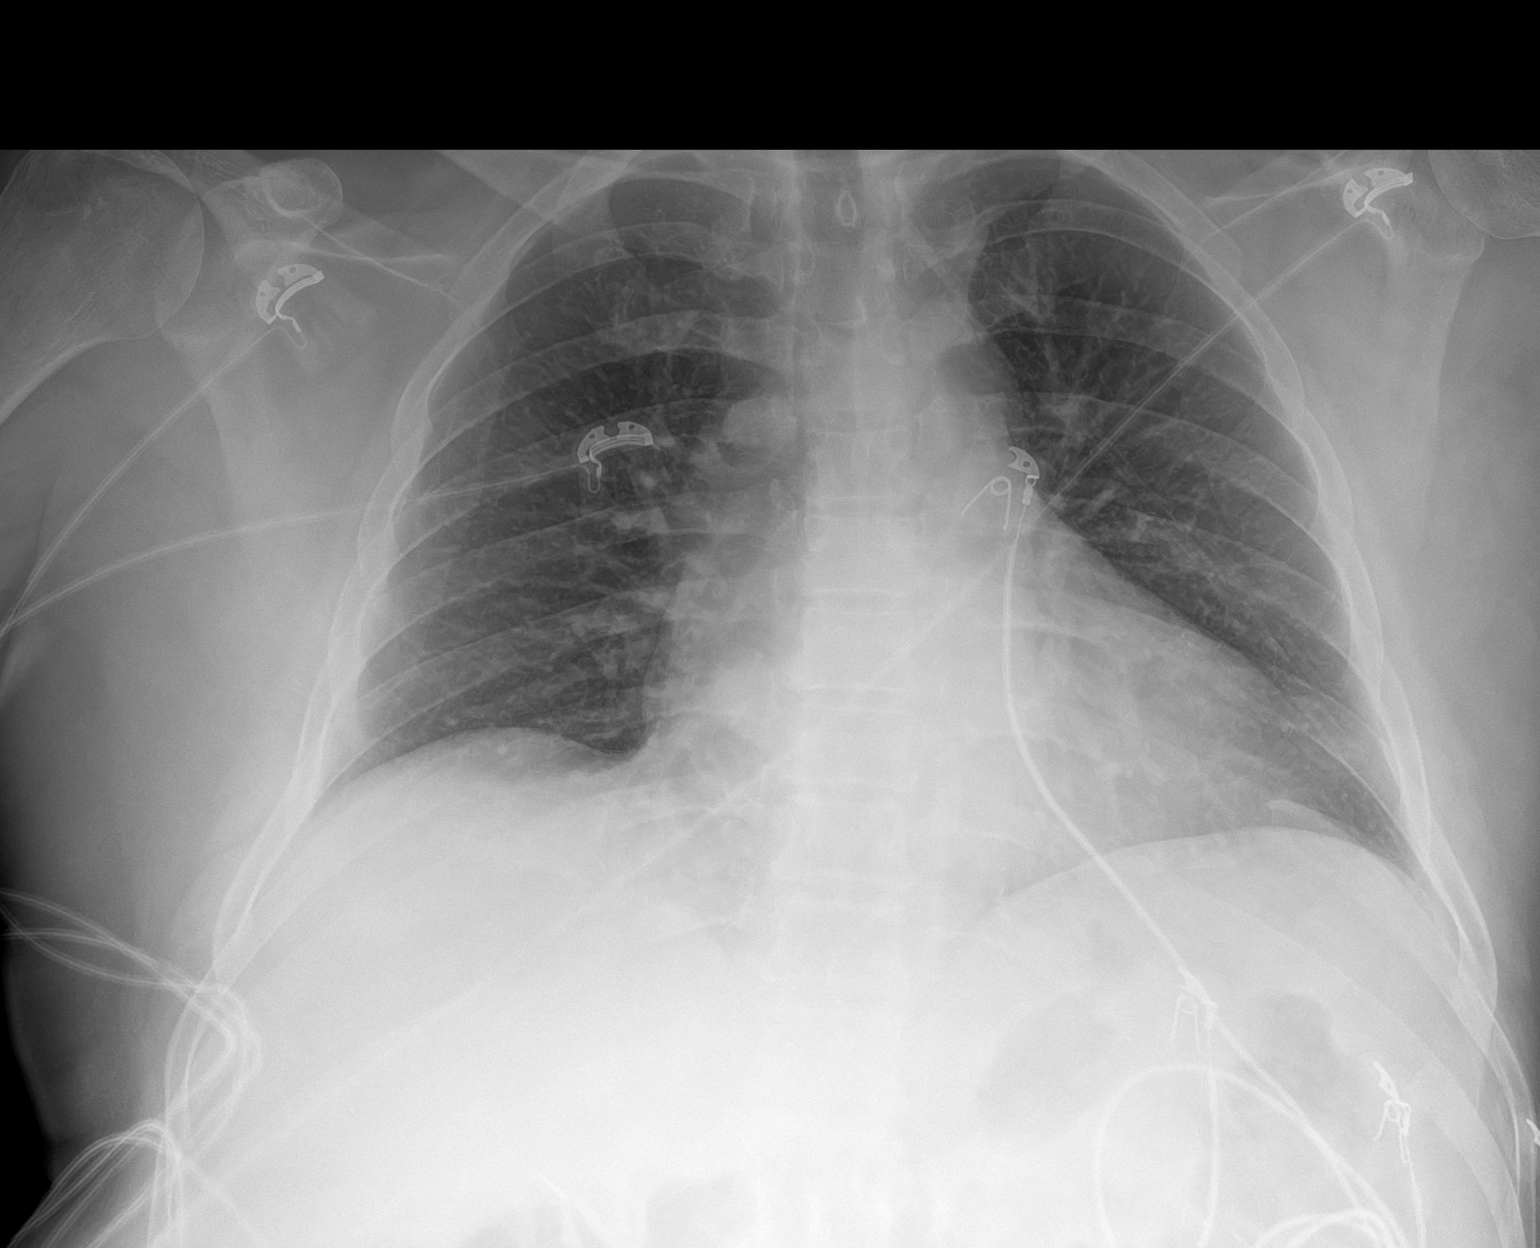

[1 of 1 positions shown; findings below may reference images not displayed]

FINDINGS: Mild cardiac enlargement. No edema, consolidation, or effusion.
Mediastinal contours appear intact.
IMPRESSION: Mild cardiac enlargement. No evidence of active pulmonary disease.

## 2021-10-08 DIAGNOSIS — D6861 Antiphospholipid syndrome: Secondary | ICD-10-CM | POA: Diagnosis not present

## 2021-10-29 DIAGNOSIS — D6861 Antiphospholipid syndrome: Secondary | ICD-10-CM | POA: Diagnosis not present

## 2021-10-29 DIAGNOSIS — Z5181 Encounter for therapeutic drug level monitoring: Secondary | ICD-10-CM | POA: Diagnosis not present

## 2021-10-29 DIAGNOSIS — Z8673 Personal history of transient ischemic attack (TIA), and cerebral infarction without residual deficits: Secondary | ICD-10-CM | POA: Diagnosis not present

## 2021-10-29 DIAGNOSIS — Z7901 Long term (current) use of anticoagulants: Secondary | ICD-10-CM | POA: Diagnosis not present

## 2021-11-04 DIAGNOSIS — Z1329 Encounter for screening for other suspected endocrine disorder: Secondary | ICD-10-CM | POA: Diagnosis not present

## 2021-11-04 DIAGNOSIS — I10 Essential (primary) hypertension: Secondary | ICD-10-CM | POA: Diagnosis not present

## 2021-11-04 DIAGNOSIS — Z8673 Personal history of transient ischemic attack (TIA), and cerebral infarction without residual deficits: Secondary | ICD-10-CM | POA: Diagnosis not present

## 2021-11-06 DIAGNOSIS — Z5181 Encounter for therapeutic drug level monitoring: Secondary | ICD-10-CM | POA: Diagnosis not present

## 2021-11-06 DIAGNOSIS — D6861 Antiphospholipid syndrome: Secondary | ICD-10-CM | POA: Diagnosis not present

## 2021-11-06 DIAGNOSIS — Z8673 Personal history of transient ischemic attack (TIA), and cerebral infarction without residual deficits: Secondary | ICD-10-CM | POA: Diagnosis not present

## 2021-11-06 DIAGNOSIS — Z7901 Long term (current) use of anticoagulants: Secondary | ICD-10-CM | POA: Diagnosis not present

## 2021-12-08 DIAGNOSIS — I10 Essential (primary) hypertension: Secondary | ICD-10-CM | POA: Diagnosis not present

## 2021-12-08 DIAGNOSIS — R6510 Systemic inflammatory response syndrome (SIRS) of non-infectious origin without acute organ dysfunction: Secondary | ICD-10-CM | POA: Diagnosis not present

## 2021-12-08 DIAGNOSIS — D6861 Antiphospholipid syndrome: Secondary | ICD-10-CM | POA: Diagnosis not present

## 2021-12-08 DIAGNOSIS — E876 Hypokalemia: Secondary | ICD-10-CM | POA: Diagnosis not present

## 2021-12-25 DIAGNOSIS — Z6833 Body mass index (BMI) 33.0-33.9, adult: Secondary | ICD-10-CM | POA: Diagnosis not present

## 2021-12-25 DIAGNOSIS — Z8673 Personal history of transient ischemic attack (TIA), and cerebral infarction without residual deficits: Secondary | ICD-10-CM | POA: Diagnosis not present

## 2021-12-25 DIAGNOSIS — E7801 Familial hypercholesterolemia: Secondary | ICD-10-CM | POA: Diagnosis not present

## 2021-12-25 DIAGNOSIS — I10 Essential (primary) hypertension: Secondary | ICD-10-CM | POA: Diagnosis not present

## 2021-12-25 DIAGNOSIS — F32A Depression, unspecified: Secondary | ICD-10-CM | POA: Diagnosis not present

## 2021-12-25 DIAGNOSIS — H538 Other visual disturbances: Secondary | ICD-10-CM | POA: Diagnosis not present

## 2021-12-25 DIAGNOSIS — D6861 Antiphospholipid syndrome: Secondary | ICD-10-CM | POA: Diagnosis not present

## 2022-01-08 DIAGNOSIS — D6861 Antiphospholipid syndrome: Secondary | ICD-10-CM | POA: Diagnosis not present

## 2022-01-15 DIAGNOSIS — Z7901 Long term (current) use of anticoagulants: Secondary | ICD-10-CM | POA: Diagnosis not present

## 2022-01-15 DIAGNOSIS — D6861 Antiphospholipid syndrome: Secondary | ICD-10-CM | POA: Diagnosis not present

## 2022-02-10 DIAGNOSIS — D6861 Antiphospholipid syndrome: Secondary | ICD-10-CM | POA: Diagnosis not present

## 2022-02-19 DIAGNOSIS — D6861 Antiphospholipid syndrome: Secondary | ICD-10-CM | POA: Diagnosis not present

## 2022-03-03 DIAGNOSIS — Z7901 Long term (current) use of anticoagulants: Secondary | ICD-10-CM | POA: Diagnosis not present

## 2022-03-03 DIAGNOSIS — D6861 Antiphospholipid syndrome: Secondary | ICD-10-CM | POA: Diagnosis not present

## 2022-03-24 DIAGNOSIS — D6861 Antiphospholipid syndrome: Secondary | ICD-10-CM | POA: Diagnosis not present

## 2022-03-24 DIAGNOSIS — Z7901 Long term (current) use of anticoagulants: Secondary | ICD-10-CM | POA: Diagnosis not present

## 2022-04-07 DIAGNOSIS — H538 Other visual disturbances: Secondary | ICD-10-CM | POA: Diagnosis not present

## 2022-04-07 DIAGNOSIS — F32A Depression, unspecified: Secondary | ICD-10-CM | POA: Diagnosis not present

## 2022-04-07 DIAGNOSIS — D6861 Antiphospholipid syndrome: Secondary | ICD-10-CM | POA: Diagnosis not present

## 2022-04-07 DIAGNOSIS — Z8673 Personal history of transient ischemic attack (TIA), and cerebral infarction without residual deficits: Secondary | ICD-10-CM | POA: Diagnosis not present

## 2022-04-07 DIAGNOSIS — B372 Candidiasis of skin and nail: Secondary | ICD-10-CM | POA: Diagnosis not present

## 2022-04-07 DIAGNOSIS — I10 Essential (primary) hypertension: Secondary | ICD-10-CM | POA: Diagnosis not present

## 2022-04-07 DIAGNOSIS — R0600 Dyspnea, unspecified: Secondary | ICD-10-CM | POA: Diagnosis not present

## 2022-04-21 DIAGNOSIS — Z7901 Long term (current) use of anticoagulants: Secondary | ICD-10-CM | POA: Diagnosis not present

## 2022-04-21 DIAGNOSIS — D6861 Antiphospholipid syndrome: Secondary | ICD-10-CM | POA: Diagnosis not present

## 2022-04-23 DIAGNOSIS — Z7901 Long term (current) use of anticoagulants: Secondary | ICD-10-CM | POA: Diagnosis not present

## 2022-04-23 DIAGNOSIS — D6861 Antiphospholipid syndrome: Secondary | ICD-10-CM | POA: Diagnosis not present

## 2022-05-06 ENCOUNTER — Encounter: Payer: Self-pay | Admitting: *Deleted

## 2022-05-07 ENCOUNTER — Encounter: Payer: Medicare Other | Admitting: Internal Medicine

## 2022-05-07 NOTE — Progress Notes (Signed)
Erroneous encounter - please disregard.

## 2022-05-20 DIAGNOSIS — D6861 Antiphospholipid syndrome: Secondary | ICD-10-CM | POA: Diagnosis not present

## 2022-06-17 DIAGNOSIS — D6861 Antiphospholipid syndrome: Secondary | ICD-10-CM | POA: Diagnosis not present

## 2022-07-14 DIAGNOSIS — Z7901 Long term (current) use of anticoagulants: Secondary | ICD-10-CM | POA: Diagnosis not present

## 2022-07-14 DIAGNOSIS — Z8673 Personal history of transient ischemic attack (TIA), and cerebral infarction without residual deficits: Secondary | ICD-10-CM | POA: Diagnosis not present

## 2022-07-14 DIAGNOSIS — D6861 Antiphospholipid syndrome: Secondary | ICD-10-CM | POA: Diagnosis not present

## 2022-07-14 DIAGNOSIS — Z5181 Encounter for therapeutic drug level monitoring: Secondary | ICD-10-CM | POA: Diagnosis not present

## 2022-08-12 DIAGNOSIS — Z8673 Personal history of transient ischemic attack (TIA), and cerebral infarction without residual deficits: Secondary | ICD-10-CM | POA: Diagnosis not present

## 2022-08-12 DIAGNOSIS — D6861 Antiphospholipid syndrome: Secondary | ICD-10-CM | POA: Diagnosis not present

## 2022-08-12 DIAGNOSIS — I10 Essential (primary) hypertension: Secondary | ICD-10-CM | POA: Diagnosis not present

## 2022-08-12 DIAGNOSIS — Z7901 Long term (current) use of anticoagulants: Secondary | ICD-10-CM | POA: Diagnosis not present

## 2022-08-12 DIAGNOSIS — Z5181 Encounter for therapeutic drug level monitoring: Secondary | ICD-10-CM | POA: Diagnosis not present

## 2022-08-12 DIAGNOSIS — H9190 Unspecified hearing loss, unspecified ear: Secondary | ICD-10-CM | POA: Diagnosis not present

## 2022-08-12 DIAGNOSIS — H547 Unspecified visual loss: Secondary | ICD-10-CM | POA: Diagnosis not present

## 2022-08-20 DIAGNOSIS — H47013 Ischemic optic neuropathy, bilateral: Secondary | ICD-10-CM | POA: Diagnosis not present

## 2022-08-20 DIAGNOSIS — H2513 Age-related nuclear cataract, bilateral: Secondary | ICD-10-CM | POA: Diagnosis not present

## 2022-09-08 DIAGNOSIS — Z8673 Personal history of transient ischemic attack (TIA), and cerebral infarction without residual deficits: Secondary | ICD-10-CM | POA: Diagnosis not present

## 2022-09-08 DIAGNOSIS — H9190 Unspecified hearing loss, unspecified ear: Secondary | ICD-10-CM | POA: Diagnosis not present

## 2022-09-08 DIAGNOSIS — Z7901 Long term (current) use of anticoagulants: Secondary | ICD-10-CM | POA: Diagnosis not present

## 2022-09-08 DIAGNOSIS — I10 Essential (primary) hypertension: Secondary | ICD-10-CM | POA: Diagnosis not present

## 2022-09-08 DIAGNOSIS — D6861 Antiphospholipid syndrome: Secondary | ICD-10-CM | POA: Diagnosis not present

## 2022-09-08 DIAGNOSIS — Z5181 Encounter for therapeutic drug level monitoring: Secondary | ICD-10-CM | POA: Diagnosis not present

## 2022-09-08 DIAGNOSIS — H547 Unspecified visual loss: Secondary | ICD-10-CM | POA: Diagnosis not present

## 2022-09-24 DIAGNOSIS — I10 Essential (primary) hypertension: Secondary | ICD-10-CM | POA: Diagnosis not present

## 2022-09-24 DIAGNOSIS — Z1321 Encounter for screening for nutritional disorder: Secondary | ICD-10-CM | POA: Diagnosis not present

## 2022-09-24 DIAGNOSIS — R5383 Other fatigue: Secondary | ICD-10-CM | POA: Diagnosis not present

## 2022-09-24 DIAGNOSIS — Z0000 Encounter for general adult medical examination without abnormal findings: Secondary | ICD-10-CM | POA: Diagnosis not present

## 2022-09-24 DIAGNOSIS — Z131 Encounter for screening for diabetes mellitus: Secondary | ICD-10-CM | POA: Diagnosis not present

## 2022-09-24 DIAGNOSIS — E7800 Pure hypercholesterolemia, unspecified: Secondary | ICD-10-CM | POA: Diagnosis not present

## 2022-09-24 DIAGNOSIS — Z13220 Encounter for screening for lipoid disorders: Secondary | ICD-10-CM | POA: Diagnosis not present

## 2022-09-24 DIAGNOSIS — Z1329 Encounter for screening for other suspected endocrine disorder: Secondary | ICD-10-CM | POA: Diagnosis not present

## 2022-10-09 DIAGNOSIS — Z0001 Encounter for general adult medical examination with abnormal findings: Secondary | ICD-10-CM | POA: Diagnosis not present

## 2022-10-13 DIAGNOSIS — D6861 Antiphospholipid syndrome: Secondary | ICD-10-CM | POA: Diagnosis not present

## 2022-10-19 DIAGNOSIS — B372 Candidiasis of skin and nail: Secondary | ICD-10-CM | POA: Diagnosis not present

## 2022-10-19 DIAGNOSIS — Z8673 Personal history of transient ischemic attack (TIA), and cerebral infarction without residual deficits: Secondary | ICD-10-CM | POA: Diagnosis not present

## 2022-10-19 DIAGNOSIS — H538 Other visual disturbances: Secondary | ICD-10-CM | POA: Diagnosis not present

## 2022-10-19 DIAGNOSIS — Z0001 Encounter for general adult medical examination with abnormal findings: Secondary | ICD-10-CM | POA: Diagnosis not present

## 2022-10-19 DIAGNOSIS — D6861 Antiphospholipid syndrome: Secondary | ICD-10-CM | POA: Diagnosis not present

## 2022-10-19 DIAGNOSIS — I10 Essential (primary) hypertension: Secondary | ICD-10-CM | POA: Diagnosis not present

## 2022-10-19 DIAGNOSIS — R0600 Dyspnea, unspecified: Secondary | ICD-10-CM | POA: Diagnosis not present

## 2022-11-18 DIAGNOSIS — D6861 Antiphospholipid syndrome: Secondary | ICD-10-CM | POA: Diagnosis not present

## 2022-12-17 DIAGNOSIS — H547 Unspecified visual loss: Secondary | ICD-10-CM | POA: Diagnosis not present

## 2022-12-17 DIAGNOSIS — H9190 Unspecified hearing loss, unspecified ear: Secondary | ICD-10-CM | POA: Diagnosis not present

## 2022-12-17 DIAGNOSIS — I10 Essential (primary) hypertension: Secondary | ICD-10-CM | POA: Diagnosis not present

## 2022-12-17 DIAGNOSIS — D6861 Antiphospholipid syndrome: Secondary | ICD-10-CM | POA: Diagnosis not present

## 2022-12-17 DIAGNOSIS — Z8673 Personal history of transient ischemic attack (TIA), and cerebral infarction without residual deficits: Secondary | ICD-10-CM | POA: Diagnosis not present

## 2022-12-17 DIAGNOSIS — Z7901 Long term (current) use of anticoagulants: Secondary | ICD-10-CM | POA: Diagnosis not present

## 2022-12-17 DIAGNOSIS — Z5181 Encounter for therapeutic drug level monitoring: Secondary | ICD-10-CM | POA: Diagnosis not present

## 2023-01-13 DIAGNOSIS — D6861 Antiphospholipid syndrome: Secondary | ICD-10-CM | POA: Diagnosis not present

## 2023-01-21 DIAGNOSIS — R739 Hyperglycemia, unspecified: Secondary | ICD-10-CM | POA: Diagnosis not present

## 2023-01-21 DIAGNOSIS — E039 Hypothyroidism, unspecified: Secondary | ICD-10-CM | POA: Diagnosis not present

## 2023-01-21 DIAGNOSIS — I10 Essential (primary) hypertension: Secondary | ICD-10-CM | POA: Diagnosis not present

## 2023-02-26 DIAGNOSIS — D6861 Antiphospholipid syndrome: Secondary | ICD-10-CM | POA: Diagnosis not present

## 2023-02-26 DIAGNOSIS — Z7901 Long term (current) use of anticoagulants: Secondary | ICD-10-CM | POA: Diagnosis not present

## 2023-04-16 DIAGNOSIS — D6861 Antiphospholipid syndrome: Secondary | ICD-10-CM | POA: Diagnosis not present

## 2023-04-29 DIAGNOSIS — R748 Abnormal levels of other serum enzymes: Secondary | ICD-10-CM | POA: Diagnosis not present

## 2023-04-29 DIAGNOSIS — Z1329 Encounter for screening for other suspected endocrine disorder: Secondary | ICD-10-CM | POA: Diagnosis not present

## 2023-04-29 DIAGNOSIS — I10 Essential (primary) hypertension: Secondary | ICD-10-CM | POA: Diagnosis not present

## 2023-04-29 DIAGNOSIS — E7800 Pure hypercholesterolemia, unspecified: Secondary | ICD-10-CM | POA: Diagnosis not present

## 2023-04-29 DIAGNOSIS — R7303 Prediabetes: Secondary | ICD-10-CM | POA: Diagnosis not present

## 2023-04-29 DIAGNOSIS — D559 Anemia due to enzyme disorder, unspecified: Secondary | ICD-10-CM | POA: Diagnosis not present

## 2023-04-29 DIAGNOSIS — D6861 Antiphospholipid syndrome: Secondary | ICD-10-CM | POA: Diagnosis not present

## 2023-04-29 DIAGNOSIS — H6123 Impacted cerumen, bilateral: Secondary | ICD-10-CM | POA: Diagnosis not present

## 2023-05-14 DIAGNOSIS — Z7901 Long term (current) use of anticoagulants: Secondary | ICD-10-CM | POA: Diagnosis not present

## 2023-05-14 DIAGNOSIS — D6861 Antiphospholipid syndrome: Secondary | ICD-10-CM | POA: Diagnosis not present

## 2023-05-14 DIAGNOSIS — Z5181 Encounter for therapeutic drug level monitoring: Secondary | ICD-10-CM | POA: Diagnosis not present

## 2023-06-11 DIAGNOSIS — Z5181 Encounter for therapeutic drug level monitoring: Secondary | ICD-10-CM | POA: Diagnosis not present

## 2023-06-11 DIAGNOSIS — D6861 Antiphospholipid syndrome: Secondary | ICD-10-CM | POA: Diagnosis not present

## 2023-06-11 DIAGNOSIS — Z7901 Long term (current) use of anticoagulants: Secondary | ICD-10-CM | POA: Diagnosis not present

## 2023-06-29 DIAGNOSIS — H5500 Unspecified nystagmus: Secondary | ICD-10-CM | POA: Diagnosis not present

## 2023-06-29 DIAGNOSIS — H11153 Pinguecula, bilateral: Secondary | ICD-10-CM | POA: Diagnosis not present

## 2023-06-29 DIAGNOSIS — H25813 Combined forms of age-related cataract, bilateral: Secondary | ICD-10-CM | POA: Diagnosis not present

## 2023-06-29 DIAGNOSIS — H47013 Ischemic optic neuropathy, bilateral: Secondary | ICD-10-CM | POA: Diagnosis not present

## 2023-07-14 DIAGNOSIS — Z5181 Encounter for therapeutic drug level monitoring: Secondary | ICD-10-CM | POA: Diagnosis not present

## 2023-07-14 DIAGNOSIS — D6861 Antiphospholipid syndrome: Secondary | ICD-10-CM | POA: Diagnosis not present

## 2023-07-14 DIAGNOSIS — Z7901 Long term (current) use of anticoagulants: Secondary | ICD-10-CM | POA: Diagnosis not present

## 2023-08-11 DIAGNOSIS — Z7901 Long term (current) use of anticoagulants: Secondary | ICD-10-CM | POA: Diagnosis not present

## 2023-08-11 DIAGNOSIS — D6861 Antiphospholipid syndrome: Secondary | ICD-10-CM | POA: Diagnosis not present

## 2023-08-11 DIAGNOSIS — Z5181 Encounter for therapeutic drug level monitoring: Secondary | ICD-10-CM | POA: Diagnosis not present

## 2023-08-11 NOTE — Telephone Encounter (Signed)
 Outgoing call to patient, instructed to hold coumadin x2 days then to restart 7.5mg  M/W/F and 10mg  all other days. Patient read back instructions and verbalized understanding.

## 2023-09-02 ENCOUNTER — Telehealth: Payer: Self-pay

## 2023-09-02 DIAGNOSIS — Z5982 Transportation insecurity: Secondary | ICD-10-CM

## 2023-09-02 DIAGNOSIS — K5900 Constipation, unspecified: Secondary | ICD-10-CM | POA: Diagnosis not present

## 2023-09-02 DIAGNOSIS — K6389 Other specified diseases of intestine: Secondary | ICD-10-CM | POA: Diagnosis not present

## 2023-09-02 DIAGNOSIS — R197 Diarrhea, unspecified: Secondary | ICD-10-CM | POA: Diagnosis not present

## 2023-09-02 DIAGNOSIS — I708 Atherosclerosis of other arteries: Secondary | ICD-10-CM | POA: Diagnosis not present

## 2023-09-02 DIAGNOSIS — R1084 Generalized abdominal pain: Secondary | ICD-10-CM | POA: Diagnosis not present

## 2023-09-02 NOTE — Progress Notes (Signed)
   Telephone encounter was:  Unsuccessful.  09/02/2023 Name: Daniel Wolf MRN: [id number] DOB: 08/26/1955  Unsuccessful outbound call made today to assist with:  Transportation Needs   Outreach Attempt:  1st Attempt    [name] [facility]  [facility] Guide, Phone: [phone] Fax: [phone] Website: [email or web address]

## 2023-09-03 ENCOUNTER — Telehealth: Payer: Self-pay

## 2023-09-03 NOTE — Progress Notes (Signed)
   Telephone encounter was:  Unsuccessful.  09/03/2023 Name: Daniel Wolf MRN: [id number] DOB: [birth year]  Unsuccessful outbound call made today to assist with:  Transportation Needs   Outreach Attempt:  2nd Attempt  No answer and unable to leave a message    [name] [facility]  [facility] Guide, Phone: [phone] Fax: [phone] Website: [email or web address]

## 2023-09-07 ENCOUNTER — Telehealth: Payer: Self-pay

## 2023-09-07 NOTE — Progress Notes (Signed)
   Telephone encounter was:  Unsuccessful.  09/07/2023 Name: Daniel Wolf MRN: [id number] DOB: 01-Mar-1955  Unsuccessful outbound call made today to assist with:  Transportation Needs   Outreach Attempt:  3rd Attempt.  Referral closed unable to contact patient.  No answer and unable to leave a message    [name] [facility] Guide, Phone: [phone] Fax: [phone] Website: [email or web address]

## 2023-09-14 DIAGNOSIS — Z5181 Encounter for therapeutic drug level monitoring: Secondary | ICD-10-CM | POA: Diagnosis not present

## 2023-09-14 DIAGNOSIS — Z7901 Long term (current) use of anticoagulants: Secondary | ICD-10-CM | POA: Diagnosis not present

## 2023-09-14 DIAGNOSIS — K5900 Constipation, unspecified: Secondary | ICD-10-CM | POA: Diagnosis not present

## 2023-09-14 DIAGNOSIS — D6861 Antiphospholipid syndrome: Secondary | ICD-10-CM | POA: Diagnosis not present

## 2023-09-15 ENCOUNTER — Encounter (INDEPENDENT_AMBULATORY_CARE_PROVIDER_SITE_OTHER): Payer: Self-pay | Admitting: *Deleted

## 2023-10-14 DIAGNOSIS — R7303 Prediabetes: Secondary | ICD-10-CM | POA: Diagnosis not present

## 2023-10-14 DIAGNOSIS — D6861 Antiphospholipid syndrome: Secondary | ICD-10-CM | POA: Diagnosis not present

## 2023-10-14 DIAGNOSIS — D649 Anemia, unspecified: Secondary | ICD-10-CM | POA: Diagnosis not present

## 2023-10-14 DIAGNOSIS — Z5181 Encounter for therapeutic drug level monitoring: Secondary | ICD-10-CM | POA: Diagnosis not present

## 2023-10-14 DIAGNOSIS — K5900 Constipation, unspecified: Secondary | ICD-10-CM | POA: Diagnosis not present

## 2023-10-14 DIAGNOSIS — I10 Essential (primary) hypertension: Secondary | ICD-10-CM | POA: Diagnosis not present

## 2023-10-14 DIAGNOSIS — Z7901 Long term (current) use of anticoagulants: Secondary | ICD-10-CM | POA: Diagnosis not present

## 2023-10-25 ENCOUNTER — Ambulatory Visit (INDEPENDENT_AMBULATORY_CARE_PROVIDER_SITE_OTHER): Admitting: Gastroenterology

## 2023-10-25 ENCOUNTER — Telehealth: Payer: Self-pay | Admitting: *Deleted

## 2023-10-25 ENCOUNTER — Encounter (INDEPENDENT_AMBULATORY_CARE_PROVIDER_SITE_OTHER): Payer: Self-pay | Admitting: Gastroenterology

## 2023-10-25 VITALS — BP 100/60 | HR 66 | Temp 97.1°F | Ht 70.0 in | Wt 235.6 lb

## 2023-10-25 DIAGNOSIS — R194 Change in bowel habit: Secondary | ICD-10-CM | POA: Diagnosis not present

## 2023-10-25 DIAGNOSIS — D6861 Antiphospholipid syndrome: Secondary | ICD-10-CM | POA: Diagnosis not present

## 2023-10-25 DIAGNOSIS — Z13220 Encounter for screening for lipoid disorders: Secondary | ICD-10-CM | POA: Diagnosis not present

## 2023-10-25 DIAGNOSIS — R1310 Dysphagia, unspecified: Secondary | ICD-10-CM | POA: Insufficient documentation

## 2023-10-25 DIAGNOSIS — D559 Anemia due to enzyme disorder, unspecified: Secondary | ICD-10-CM | POA: Diagnosis not present

## 2023-10-25 NOTE — Telephone Encounter (Signed)
 Clearance faxed to [facility].

## 2023-10-25 NOTE — Telephone Encounter (Signed)
 10/25/23  Daniel Wolf April 15, 1955  What type of surgery is being performed? COLONOSCOPY/EGD  What type of clearance is required (medical or pharmacy to hold medication or both? MEDICATION  Are there any medications that need to be held prior to surgery and how long? COUMADIN X 5 DAYS PRIOR TO PROCEDURE   Name of physician performing surgery?  Dr. Cinderella Rouse [facility] Phone: [phone] Fax: [phone]  Anethesia type (none, local, MAC, general)? MAC   ? Yes ? No Patient can hold medication as requested   Signature: ___________________________

## 2023-10-25 NOTE — Progress Notes (Signed)
 Referring Provider: Trudy Vaughn FALCON, MD Primary Care Physician:  Trudy Vaughn FALCON, MD Primary GI Physician: New (Dr. Cinderella)  Chief Complaint  Patient presents with   Change in Bowel Habits    Pt arrives for changes in bowel habits. Pt states he alternates between constipation and diarrhea. Pt is taking Miralax BID and states for the past month, he has been doing well. No pain. Pt states he is not active and that may be why.     HPI:   Daniel Wolf is a 68 y.o. male with past medical history of CVA, depression, HLD, HTN, TIA, antiphospholipid antibody syndrome   Patient presenting today as a new patient for: Bowel habit changes  Dysphagia   Most recent labs with TSH 2.396, sodium 139, potassium 4.5 LFTs WNL, hgb 13.6 CT A/P wo contrast on 09/02/23: no acute abnormality   Reports sudden onset of diarrhea and fecal incontinence back in July, PCP did labs and CT as above, started him on miralax and now having regular BMs daily. Denies abdominal pain, rectal bleeding, melena, nausea, vomiting, weight loss. May be eating slightly less than he used to but no overt appetite changes. Endorses some dysphagia, choking episodes about every other time he eats. No odynophagia. Denies GERD symptoms. Not taking any PPI therapy or H2B at this time.   NSAID use: none Social hx: no etoh or tobacco  Fam hx: negative for CRC or liver disease   Last Colonoscopy: never   Last Endoscopy: never   [employer]   10/25/23 0831  Weight: 235 lb 9.6 oz (106.9 kg)     Past Medical History:  Diagnosis Date   Antiphospholipid antibody syndrome (HCC)    CVA (cerebral vascular accident) (HCC)    Depression    Hyperlipidemia    Hypertension    Mini stroke     History reviewed. No pertinent surgical history.  Current Outpatient Medications  Medication Sig Dispense Refill   aspirin  EC 81 MG EC tablet Take 1 tablet (81 mg total) by mouth daily with breakfast. Swallow whole. 30 tablet 11    atorvastatin  (LIPITOR) 80 MG tablet Take 80 mg by mouth daily.     warfarin (COUMADIN) 10 MG tablet Take 1 tablet every day of the week. Contact office with questions.     warfarin (COUMADIN) 2.5 MG tablet TAKE 1 TABLET BY MOUTH DAILY (along WITH 5 MG TABLET TO equal 7.5 MG DAILY)     warfarin (COUMADIN) 5 MG tablet Take by mouth.     No current facility-administered medications for this visit.    Allergies as of 10/25/2023   (No Known Allergies)    Social History   Socioeconomic History   Marital status: Widowed    Spouse name: Not on file   Number of children: Not on file   Years of education: Not on file   Highest education level: Not on file  Occupational History   Not on file  Tobacco Use   Smoking status: Never   Smokeless tobacco: Never  Substance and Sexual Activity   Alcohol  use: Not on file   Drug use: Not Currently   Sexual activity: Not Currently  Other Topics Concern   Not on file  Social History Narrative   Not on file   Social Drivers of Health   Financial Resource Strain: Low Risk  (02/27/2021)   Received from [facility]   Overall Financial Resource Strain (CARDIA)    Difficulty of Paying Living Expenses:  Not hard at all  Food Insecurity: No Food Insecurity (02/27/2021)   Received from [facility]   Hunger Vital Sign    Within the past 12 months, you worried that your food would run out before you got the money to buy more.: Never true    Within the past 12 months, the food you bought just didn't last and you didn't have money to get more.: Never true  Transportation Needs: No Transportation Needs (02/27/2021)   Received from [facility] - Transportation    Lack of Transportation (Medical): No    Lack of Transportation (Non-Medical): No  Physical Activity: Inactive (02/27/2021)   Received from [facility]   Exercise Vital Sign    On average, how many days per week do you engage in moderate to strenuous exercise (like a brisk  walk)?: 0 days    On average, how many minutes do you engage in exercise at this level?: 0 min  Stress: Stress Concern Present (02/27/2021)   Received from [facility] - Occupational Stress Questionnaire    Feeling of Stress : To some extent  Social Connections: Socially Isolated (02/27/2021)   Received from [facility]   Social Connection and Isolation Panel    In a typical week, how many times do you talk on the phone with family, friends, or neighbors?: Never    How often do you get together with friends or relatives?: Never    How often do you attend church or religious services?: Never    Do you belong to any clubs or organizations such as church groups, unions, fraternal or athletic groups, or school groups?: No    How often do you attend meetings of the clubs or organizations you belong to?: Never    Are you married, widowed, divorced, separated, never married, or living with a partner?: Widowed    Review of systems General: negative for malaise, night sweats, fever, chills, weight loss Neck: Negative for lumps, goiter, pain and significant neck swelling Resp: Negative for cough, wheezing, dyspnea at rest CV: Negative for chest pain, leg swelling, palpitations, orthopnea GI: denies melena, hematochezia, nausea, vomiting, constipation, odynophagia, early satiety or unintentional weight loss. +bowel habit changes/incontinence +dysphagia  MSK: Negative for joint pain or swelling, back pain, and muscle pain. Derm: Negative for itching or rash Psych: Denies depression, anxiety, memory loss, confusion. No homicidal or suicidal ideation.  Heme: Negative for prolonged bleeding, bruising easily, and swollen nodes. Endocrine: Negative for cold or heat intolerance, polyuria, polydipsia and goiter. Neuro: negative for tremor, gait imbalance, syncope and seizures. The remainder of the review of systems is noncontributory.  Physical Exam: BP  100/60   Pulse 66   Temp (!) 97.1 F (36.2 C)   Ht 5' 10 (1.778 m)   Wt 235 lb 9.6 oz (106.9 kg)   BMI 33.81 kg/m  General:   Alert and oriented. No distress noted. Pleasant and cooperative.  Head:  Normocephalic and atraumatic. Eyes:  Conjuctiva clear without scleral icterus. Mouth:  Oral mucosa pink and moist. Good dentition. No lesions. Heart: Normal rate and rhythm, s1 and s2 heart sounds present.  Lungs: Clear lung sounds in all lobes. Respirations equal and unlabored. Abdomen:  +BS, soft, non-tender and non-distended. No rebound or guarding. No HSM or masses noted. Derm: No palmar erythema or jaundice Msk:  Symmetrical without gross deformities. Normal posture. Extremities:  Without edema. Neurologic:  Alert and  oriented x4 Psych:  Alert and cooperative. Normal mood and affect.  Invalid input(s): 6 MONTHS   ASSESSMENT: Daniel Wolf is a 67 y.o. male presenting today as a new patient for bowel habit changes and dysphagia  Bowel habit changes: sudden onset of diarrhea and fecal incontinence with normal labs and cross sectional imaging, as above, symptoms seemed to have resolved with 2 capfuls of miralax BID. He denies abdominal pain, rectal bleeding or melena. No history of constipation. TSh and electrolytes WNL. No previous colonoscopy. Recommend proceeding with colonoscopy as he has never had a screening and given bowel habit changes. For now can continue with miralax 2 capfuls per day.   Dysphagia: ongoing for a while, occurring about every other time he eats. Denies any GERD symptoms, nausea or vomiting. Recommend EGD at time of colonoscopy for further evaluation as I cannot rule out esophageal ring, web, stricture, stenosis, esophagitis, less likely malignancy   Indications, risks and benefits of procedure discussed in detail with patient. Patient verbalized understanding and is in agreement to proceed with EGD and Colonoscopy.    PLAN:  -continue miralax  BID -Increase water intake, aim for atleast 64 oz per day -Increase fruits, veggies and whole grains, kiwi and prunes are especially good for constipation -schedule Colonoscopy and EGD, ASA III, hold coumadin, hematology clearance   All questions were answered, patient verbalized understanding and is in agreement with plan as outlined above.   Follow Up: 3 months   Tereka Thorley L. [place]iance [place], MSN, APRN, AGNP-C Adult-Gerontology Nurse Practitioner [facility]

## 2023-10-25 NOTE — Patient Instructions (Signed)
 Increase water intake, aim for atleast 64 oz per day Increase fruits, veggies and whole grains, kiwi and prunes are especially good for constipation Please continue miralax 2 capfuls per day We will get you scheduled for Upper endoscopy and colonoscopy  Follow up 3 months

## 2023-10-27 ENCOUNTER — Telehealth (INDEPENDENT_AMBULATORY_CARE_PROVIDER_SITE_OTHER): Payer: Self-pay | Admitting: Gastroenterology

## 2023-10-27 NOTE — Telephone Encounter (Signed)
 Patient seen in office on Monday 9/15. He called today saying he lost his insurance today and wanted to cancel anything in the future for him until he can get insurance. I cancelled his 3 month follow up in Dec per his request and added him to the recall.

## 2023-10-27 NOTE — Telephone Encounter (Signed)
 Noted. We didn't schedule for anything yet as we were waiting in clearance but will also let Chelsea know.

## 2023-11-01 NOTE — Telephone Encounter (Signed)
 Daniel Wolf SS   10/27/23 12:10 PM Note Patient seen in office on Monday 9/15. He called today saying he lost his insurance today and wanted to cancel anything in the future for him until he can get insurance. I cancelled his 3 month follow up in Dec per his request and added him to the recall.

## 2023-11-01 NOTE — Telephone Encounter (Signed)
Clearance received and scanned into media.

## 2023-11-09 DIAGNOSIS — Z7901 Long term (current) use of anticoagulants: Secondary | ICD-10-CM | POA: Diagnosis not present

## 2023-11-09 DIAGNOSIS — D6861 Antiphospholipid syndrome: Secondary | ICD-10-CM | POA: Diagnosis not present

## 2023-11-09 DIAGNOSIS — Z5181 Encounter for therapeutic drug level monitoring: Secondary | ICD-10-CM | POA: Diagnosis not present

## 2023-12-08 DIAGNOSIS — D6861 Antiphospholipid syndrome: Secondary | ICD-10-CM | POA: Diagnosis not present

## 2023-12-23 ENCOUNTER — Encounter (INDEPENDENT_AMBULATORY_CARE_PROVIDER_SITE_OTHER): Payer: Self-pay | Admitting: Gastroenterology

## 2024-01-24 ENCOUNTER — Ambulatory Visit (INDEPENDENT_AMBULATORY_CARE_PROVIDER_SITE_OTHER): Admitting: Gastroenterology

## 2024-02-14 NOTE — Telephone Encounter (Signed)
 pt's levels are being checked by Dr Orpha. cancelling all further appts.

## 2024-02-23 ENCOUNTER — Encounter (INDEPENDENT_AMBULATORY_CARE_PROVIDER_SITE_OTHER): Payer: Self-pay | Admitting: *Deleted
